# Patient Record
Sex: Female | Born: 1960
Health system: Southern US, Community
[De-identification: ages and names within clinical notes are randomized; demographics above are authoritative.]

## PROBLEM LIST (undated history)

## (undated) DIAGNOSIS — Z780 Asymptomatic menopausal state: Secondary | ICD-10-CM

## (undated) DIAGNOSIS — K219 Gastro-esophageal reflux disease without esophagitis: Secondary | ICD-10-CM

## (undated) DIAGNOSIS — T7840XA Allergy, unspecified, initial encounter: Secondary | ICD-10-CM

## (undated) HISTORY — DX: Gastro-esophageal reflux disease without esophagitis: K21.9

## (undated) HISTORY — DX: Asymptomatic menopausal state: Z78.0

## (undated) HISTORY — DX: Allergy, unspecified, initial encounter: T78.40XA

---

## 1999-10-08 ENCOUNTER — Encounter: Payer: Self-pay | Admitting: Emergency Medicine

## 1999-10-08 ENCOUNTER — Emergency Department (HOSPITAL_COMMUNITY): Admission: EM | Admit: 1999-10-08 | Discharge: 1999-10-08 | Payer: Self-pay

## 2011-05-11 ENCOUNTER — Encounter: Payer: Self-pay | Admitting: Physician Assistant

## 2011-08-13 ENCOUNTER — Encounter: Payer: Self-pay | Admitting: Physician Assistant

## 2011-08-21 ENCOUNTER — Ambulatory Visit: Payer: Managed Care, Other (non HMO) | Attending: *Deleted | Admitting: Physical Therapy

## 2011-08-21 DIAGNOSIS — IMO0001 Reserved for inherently not codable concepts without codable children: Secondary | ICD-10-CM | POA: Insufficient documentation

## 2011-08-21 DIAGNOSIS — R5381 Other malaise: Secondary | ICD-10-CM | POA: Insufficient documentation

## 2011-08-21 DIAGNOSIS — M25559 Pain in unspecified hip: Secondary | ICD-10-CM | POA: Insufficient documentation

## 2011-08-23 ENCOUNTER — Ambulatory Visit: Payer: Managed Care, Other (non HMO) | Admitting: Physical Therapy

## 2012-02-10 DIAGNOSIS — E0500 Thyrotoxicosis with diffuse goiter without thyrotoxic crisis or storm: Secondary | ICD-10-CM | POA: Insufficient documentation

## 2012-06-05 ENCOUNTER — Telehealth: Payer: Self-pay

## 2012-06-05 NOTE — Telephone Encounter (Signed)
LMOM to call.

## 2012-07-14 NOTE — Telephone Encounter (Signed)
LMOM to call.

## 2012-07-25 NOTE — Telephone Encounter (Signed)
Letter to pt and PCP.  

## 2013-05-21 ENCOUNTER — Encounter: Payer: Self-pay | Admitting: Physician Assistant

## 2015-07-19 DIAGNOSIS — H524 Presbyopia: Secondary | ICD-10-CM | POA: Diagnosis not present

## 2015-07-19 DIAGNOSIS — Z0100 Encounter for examination of eyes and vision without abnormal findings: Secondary | ICD-10-CM | POA: Diagnosis not present

## 2015-10-14 DIAGNOSIS — E0500 Thyrotoxicosis with diffuse goiter without thyrotoxic crisis or storm: Secondary | ICD-10-CM | POA: Diagnosis not present

## 2016-02-22 ENCOUNTER — Telehealth: Payer: Self-pay | Admitting: Physician Assistant

## 2016-02-22 MED ORDER — BETAMETHASONE DIPROPIONATE 0.05 % EX CREA: TOPICAL_CREAM | Freq: Two times a day (BID) | CUTANEOUS | 0 refills | Status: DC

## 2016-02-22 NOTE — Telephone Encounter (Signed)
Patient aware.

## 2016-02-22 NOTE — Telephone Encounter (Signed)
Cream, inhaler or nasal spray?

## 2016-02-22 NOTE — Telephone Encounter (Signed)
Please review and advise.

## 2016-02-22 NOTE — Telephone Encounter (Signed)
Betamethasone cream ? ?

## 2016-03-27 ENCOUNTER — Ambulatory Visit (INDEPENDENT_AMBULATORY_CARE_PROVIDER_SITE_OTHER): Payer: Managed Care, Other (non HMO) | Admitting: Physician Assistant

## 2016-03-27 ENCOUNTER — Encounter: Payer: Self-pay | Admitting: Physician Assistant

## 2016-03-27 ENCOUNTER — Encounter (INDEPENDENT_AMBULATORY_CARE_PROVIDER_SITE_OTHER): Payer: Self-pay

## 2016-03-27 VITALS — BP 121/81 | HR 79 | Temp 97.4°F | Ht 64.25 in | Wt 138.2 lb

## 2016-03-27 DIAGNOSIS — L500 Allergic urticaria: Secondary | ICD-10-CM | POA: Insufficient documentation

## 2016-03-27 DIAGNOSIS — K219 Gastro-esophageal reflux disease without esophagitis: Secondary | ICD-10-CM

## 2016-03-27 DIAGNOSIS — Z780 Asymptomatic menopausal state: Secondary | ICD-10-CM | POA: Insufficient documentation

## 2016-03-27 DIAGNOSIS — L989 Disorder of the skin and subcutaneous tissue, unspecified: Secondary | ICD-10-CM | POA: Insufficient documentation

## 2016-03-27 DIAGNOSIS — N951 Menopausal and female climacteric states: Secondary | ICD-10-CM | POA: Insufficient documentation

## 2016-03-27 DIAGNOSIS — B001 Herpesviral vesicular dermatitis: Secondary | ICD-10-CM | POA: Insufficient documentation

## 2016-03-27 DIAGNOSIS — Z01419 Encounter for gynecological examination (general) (routine) without abnormal findings: Secondary | ICD-10-CM | POA: Diagnosis not present

## 2016-03-27 DIAGNOSIS — R238 Other skin changes: Secondary | ICD-10-CM

## 2016-03-27 DIAGNOSIS — J3089 Other allergic rhinitis: Secondary | ICD-10-CM | POA: Insufficient documentation

## 2016-03-27 MED ORDER — AMOXICILLIN-POT CLAVULANATE 875-125 MG PO TABS: 1.0000 | ORAL_TABLET | Freq: Two times a day (BID) | ORAL | 0 refills | Status: DC

## 2016-03-27 MED ORDER — RANITIDINE HCL 300 MG PO TABS: 300.0000 mg | ORAL_TABLET | Freq: Every day | ORAL | 11 refills | Status: DC

## 2016-03-27 MED ORDER — GABAPENTIN 100 MG PO CAPS: 100.0000 mg | ORAL_CAPSULE | Freq: Every day | ORAL | 3 refills | Status: DC

## 2016-03-27 MED ORDER — CONJ ESTROG-MEDROXYPROGEST ACE 0.625-2.5 MG PO TABS: 2.0000 | ORAL_TABLET | Freq: Every day | ORAL | 11 refills | Status: DC

## 2016-03-27 NOTE — Patient Instructions (Signed)
Hives Hives are itchy, red, swollen areas of the skin. They can vary in size and location on your body. Hives can come and go for hours or several days (acute hives) or for several weeks (chronic hives). Hives do not spread from person to person (noncontagious). They may get worse with scratching, exercise, and emotional stress. CAUSES   Allergic reaction to food, additives, or drugs.  Infections, including the common cold.  Illness, such as vasculitis, lupus, or thyroid disease.  Exposure to sunlight, heat, or cold.  Exercise.  Stress.  Contact with chemicals. SYMPTOMS   Red or white swollen patches on the skin. The patches may change size, shape, and location quickly and repeatedly.  Itching.  Swelling of the hands, feet, and face. This may occur if hives develop deeper in the skin. DIAGNOSIS  Your caregiver can usually tell what is wrong by performing a physical exam. Skin or blood tests may also be done to determine the cause of your hives. In some cases, the cause cannot be determined. TREATMENT  Mild cases usually get better with medicines such as antihistamines. Severe cases may require an emergency epinephrine injection. If the cause of your hives is known, treatment includes avoiding that trigger.  HOME CARE INSTRUCTIONS   Avoid causes that trigger your hives.  Take antihistamines as directed by your caregiver to reduce the severity of your hives. Non-sedating or low-sedating antihistamines are usually recommended. Do not drive while taking an antihistamine.  Take any other medicines prescribed for itching as directed by your caregiver.  Wear loose-fitting clothing.  Keep all follow-up appointments as directed by your caregiver. SEEK MEDICAL CARE IF:   You have persistent or severe itching that is not relieved with medicine.  You have painful or swollen joints. SEEK IMMEDIATE MEDICAL CARE IF:   You have a fever.  Your tongue or lips are swollen.  You have  trouble breathing or swallowing.  You feel tightness in the throat or chest.  You have abdominal pain. These problems may be the first sign of a life-threatening allergic reaction. Call your local emergency services (911 in [place] MAKE SURE YOU:   Understand these instructions.  Will watch your condition.  Will get help right away if you are not doing well or get worse.   This information is not intended to replace advice given to you by your health care provider. Make sure you discuss any questions you have with your health care provider.   Document Released: 05/21/2005 Document Revised: 05/26/2013 Document Reviewed: 08/14/2011 Elsevier Interactive Patient Education 2016 Elsevier Inc.  

## 2016-03-27 NOTE — Progress Notes (Signed)
BP 121/81   Pulse 79   Temp 97.4 F (36.3 C) (Oral)   Ht 5' 4.25" (1.632 m)   Wt 138 lb 3.2 oz (62.7 kg)   LMP 04/02/2015   BMI 23.54 kg/m    Subjective:    Patient ID: Alexandria Ingram, female    DOB: 06/02/1961, 55 y.o.   MRN: [id number]  Alexandria Ingram is a 55 y.o. female presenting on 03/27/2016 for Annual Exam  HPI Patient here to be established as new patient at [facility].  This patient is known to me from [facility].  This patient comes in for her annual well exam. She is having severe menopausal symptoms include seeing vasomotor symptoms throughout the day and night. She is having vulvar symptoms of very dry at times and burning and hurting in the fall and labial areas. She has noted a change in the scan. It does affect the entire vulvar area, completely to the anus.  Both anti-fungal and betamethasone cream has not improved the condition in this area. She had these medications from before.  She is continuing with chronic allergic rhinitis and urticarial hives. Over-the-counter is not making much difference in the treatment of the urticaria. We will seek further treatment on this.  She needs refills on her regular maintenance medications. We will send these as needed. We have reviewed her lab work today. She is able to get that then performed through her work. Only her LDL was slightly elevated at 140 and we strongly encourage her diet and exercise to be stronger to recheck this in 6 months. All of her other labs are within normal limits.  Past Medical History:  Diagnosis Date  . Allergy   . GERD (gastroesophageal reflux disease)   . Menopause      Relevant past medical, surgical, family and social history reviewed and updated as indicated. Interim medical history since our last visit reviewed. Allergies and medications reviewed and updated.   Data reviewed from any sources in EPIC.  Review of Systems  Constitutional: Negative.   Negative for activity change, fatigue, fever and unexpected weight change.  HENT: Positive for postnasal drip and sneezing.   Eyes: Positive for itching.  Respiratory: Negative.  Negative for cough.   Cardiovascular: Negative.  Negative for chest pain.  Gastrointestinal: Negative.  Negative for abdominal pain.  Endocrine: Negative.   Genitourinary: Positive for vaginal pain. Negative for dysuria, genital sores, hematuria and menstrual problem.  Musculoskeletal: Negative.   Skin: Positive for color change and rash.  Neurological: Negative.      Social History   Social History  . Marital status: Single    Spouse name: N/A  . Number of children: N/A  . Years of education: N/A   Occupational History  . Not on file.   Social History Main Topics  . Smoking status: Never Smoker  . Smokeless tobacco: Never Used  . Alcohol use No  . Drug use: No  . Sexual activity: Not on file   Other Topics Concern  . Not on file   Social History Narrative  . No narrative on file    History reviewed. No pertinent surgical history.  History reviewed. No pertinent family history.    Medication List       Accurate as of 03/27/16  1:37 PM. Always use your most recent med list.          amoxicillin-clavulanate 875-125 MG tablet Commonly known as:  AUGMENTIN Take 1 tablet  by mouth 2 (two) times daily.   betamethasone dipropionate 0.05 % cream Commonly known as:  DIPROLENE Apply topically 2 (two) times daily.   esomeprazole 40 MG capsule Commonly known as:  NEXIUM Take 40 mg by mouth daily.   estrogen (conjugated)-medroxyprogesterone 0.625-2.5 MG tablet Commonly known as:  PREMPRO Take 2 tablets by mouth daily.   gabapentin 100 MG capsule Commonly known as:  NEURONTIN Take 1-3 capsules (100-300 mg total) by mouth at bedtime.   ranitidine 300 MG tablet Commonly known as:  ZANTAC Take 1 tablet (300 mg total) by mouth at bedtime.   valACYclovir 1000 MG tablet Commonly known  as:  VALTREX   ZOVIRAX 5 % Generic drug:  acyclovir cream          Objective:    BP 121/81   Pulse 79   Temp 97.4 F (36.3 C) (Oral)   Ht 5' 4.25" (1.632 m)   Wt 138 lb 3.2 oz (62.7 kg)   LMP 04/02/2015   BMI 23.54 kg/m   Allergies  Allergen Reactions  . Latex Swelling   Wt Readings from Last 3 Encounters:  03/27/16 138 lb 3.2 oz (62.7 kg)    Physical Exam  Constitutional: She is oriented to person, place, and time. She appears well-developed and well-nourished.  HENT:  Head: Normocephalic and atraumatic.  Eyes: Conjunctivae and EOM are normal. Pupils are equal, round, and reactive to light.  Neck: Normal range of motion. Neck supple.  Cardiovascular: Normal rate, regular rhythm, normal heart sounds and intact distal pulses.   Pulmonary/Chest: Effort normal and breath sounds normal. Right breast exhibits no mass, no skin change and no tenderness. Left breast exhibits no mass, no skin change and no tenderness. Breasts are symmetrical.  Abdominal: Soft. Bowel sounds are normal.  Genitourinary: Uterus normal. Rectal exam shows no fissure. No breast swelling, tenderness, discharge or bleeding. There is no tenderness or lesion on the right labia. There is no tenderness or lesion on the left labia. Uterus is not deviated, not enlarged and not tender. Cervix exhibits no motion tenderness, no discharge and no friability. Right adnexum displays no mass, no tenderness and no fullness. Left adnexum displays no mass, no tenderness and no fullness. There is tenderness in the vagina. No bleeding in the vagina. No vaginal discharge found.  Genitourinary Comments: At the posterior half of both labia there is a loss of pigment to her skin. This is a new finding. I have performed her exam several years in the road and this is new.  Neurological: She is alert and oriented to person, place, and time. She has normal reflexes.  Skin: Skin is warm and dry. No rash noted.  Psychiatric: She has a  normal mood and affect. Her behavior is normal. Judgment and thought content normal.       Assessment & Plan:   1. Well female exam with routine gynecological exam - Pap IG w/ reflex to HPV when ASC-U  2. Menopause - estrogen, conjugated,-medroxyprogesterone (PREMPRO) 0.625-2.5 MG tablet; Take 2 tablets by mouth daily.  Dispense: 60 tablet; Refill: 11  3. Menopausal vasomotor syndrome - gabapentin (NEURONTIN) 100 MG capsule; Take 1-3 capsules (100-300 mg total) by mouth at bedtime.  Dispense: 90 capsule; Refill: 3  4. Chronic nonseasonal allergic rhinitis due to pollen  5. Allergic urticaria - ranitidine (ZANTAC) 300 MG tablet; Take 1 tablet (300 mg total) by mouth at bedtime.  Dispense: 30 tablet; Refill: 11  6. Abnormal skin of vulva - Ambulatory referral to  Dermatology  7. Abnormal skin color - Ambulatory referral to Dermatology  8. Gastroesophageal reflux disease without esophagitis - esomeprazole (NEXIUM) 40 MG capsule; Take 40 mg by mouth daily.; Refill: 5  9. Fever blister - ZOVIRAX 5 %; ; Refill: 0 - valACYclovir (VALTREX) 1000 MG tablet; ; Refill: 0   Continue all other maintenance medications as listed above. Educational handout given for urticaria  Follow up plan: Return if symptoms worsen or fail to improve.  Remus Loffler PA-C [facility] [address]  [place], [place] [address] [phone]   03/27/2016, 1:37 PM

## 2016-04-12 ENCOUNTER — Telehealth: Payer: Self-pay | Admitting: Physician Assistant

## 2016-04-13 NOTE — Telephone Encounter (Signed)
noted 

## 2016-04-23 ENCOUNTER — Other Ambulatory Visit: Payer: Self-pay | Admitting: Physician Assistant

## 2016-04-23 DIAGNOSIS — Z780 Asymptomatic menopausal state: Secondary | ICD-10-CM

## 2016-04-23 MED ORDER — CONJ ESTROG-MEDROXYPROGEST ACE 0.625-2.5 MG PO TABS: 2.0000 | ORAL_TABLET | Freq: Every day | ORAL | 3 refills | Status: DC

## 2016-04-23 NOTE — Telephone Encounter (Signed)
Patient aware that rx sent to pharmacy. 

## 2016-04-23 NOTE — Telephone Encounter (Signed)
sent 

## 2016-05-17 ENCOUNTER — Telehealth: Payer: Self-pay | Admitting: Physician Assistant

## 2016-05-17 NOTE — Telephone Encounter (Signed)
Pt given appt for mobile mammogram 10/01/16 at 10:40.

## 2016-05-22 ENCOUNTER — Other Ambulatory Visit: Payer: Self-pay | Admitting: Physician Assistant

## 2016-06-11 ENCOUNTER — Encounter (INDEPENDENT_AMBULATORY_CARE_PROVIDER_SITE_OTHER): Payer: Self-pay

## 2016-06-12 ENCOUNTER — Telehealth: Payer: Self-pay | Admitting: Physician Assistant

## 2016-06-12 DIAGNOSIS — M545 Low back pain, unspecified: Secondary | ICD-10-CM

## 2016-06-12 DIAGNOSIS — G8929 Other chronic pain: Secondary | ICD-10-CM

## 2016-06-12 MED ORDER — LIDOCAINE 5 % EX PTCH: 3.0000 | MEDICATED_PATCH | CUTANEOUS | 6 refills | Status: DC

## 2016-06-12 NOTE — Telephone Encounter (Signed)
Patient aware.

## 2016-06-12 NOTE — Telephone Encounter (Signed)
Prescription sent to pharmacy.

## 2016-06-18 ENCOUNTER — Other Ambulatory Visit: Payer: Self-pay | Admitting: Physician Assistant

## 2016-06-19 ENCOUNTER — Telehealth: Payer: Self-pay | Admitting: Physician Assistant

## 2016-06-19 DIAGNOSIS — B001 Herpesviral vesicular dermatitis: Secondary | ICD-10-CM

## 2016-06-19 MED ORDER — ZOVIRAX 5 % EX CREA: 1.0000 "application " | TOPICAL_CREAM | CUTANEOUS | 11 refills | Status: DC

## 2016-06-19 NOTE — Telephone Encounter (Signed)
done

## 2016-06-19 NOTE — Telephone Encounter (Signed)
Prescription sent to pharmacy.

## 2016-08-01 ENCOUNTER — Ambulatory Visit (INDEPENDENT_AMBULATORY_CARE_PROVIDER_SITE_OTHER): Payer: 59 | Admitting: Physician Assistant

## 2016-08-01 VITALS — BP 130/81 | HR 76 | Temp 98.9°F | Ht 64.25 in | Wt 143.2 lb

## 2016-08-01 DIAGNOSIS — R232 Flushing: Secondary | ICD-10-CM | POA: Diagnosis not present

## 2016-08-01 DIAGNOSIS — Z780 Asymptomatic menopausal state: Secondary | ICD-10-CM | POA: Diagnosis not present

## 2016-08-01 DIAGNOSIS — R69 Illness, unspecified: Secondary | ICD-10-CM | POA: Diagnosis not present

## 2016-08-01 DIAGNOSIS — R51 Headache: Secondary | ICD-10-CM

## 2016-08-01 DIAGNOSIS — R519 Headache, unspecified: Secondary | ICD-10-CM | POA: Insufficient documentation

## 2016-08-01 DIAGNOSIS — Z0289 Encounter for other administrative examinations: Secondary | ICD-10-CM

## 2016-08-01 DIAGNOSIS — G43809 Other migraine, not intractable, without status migrainosus: Secondary | ICD-10-CM | POA: Diagnosis not present

## 2016-08-01 DIAGNOSIS — F339 Major depressive disorder, recurrent, unspecified: Secondary | ICD-10-CM

## 2016-08-01 DIAGNOSIS — G43909 Migraine, unspecified, not intractable, without status migrainosus: Secondary | ICD-10-CM | POA: Insufficient documentation

## 2016-08-01 MED ORDER — KETOROLAC TROMETHAMINE 60 MG/2ML IM SOLN
60.0000 mg | Freq: Once | INTRAMUSCULAR | Status: AC
  Administered 2016-08-01: 60 mg via INTRAMUSCULAR

## 2016-08-01 MED ORDER — CONJ ESTROG-MEDROXYPROGEST ACE 0.625-2.5 MG PO TABS: 1.0000 | ORAL_TABLET | Freq: Every day | ORAL | 3 refills | Status: DC

## 2016-08-01 MED ORDER — SERTRALINE HCL 50 MG PO TABS: 50.0000 mg | ORAL_TABLET | Freq: Every day | ORAL | 5 refills | Status: DC

## 2016-08-01 NOTE — Patient Instructions (Signed)

## 2016-08-02 ENCOUNTER — Telehealth: Payer: Self-pay | Admitting: Physician Assistant

## 2016-08-02 NOTE — Telephone Encounter (Signed)
NA 3/1-jhb

## 2016-08-02 NOTE — Telephone Encounter (Signed)
Try reducing dose to half tablet daily with a meal to see if it resolves. Call next week if not improved.

## 2016-08-03 ENCOUNTER — Encounter: Payer: Self-pay | Admitting: Physician Assistant

## 2016-08-03 NOTE — Progress Notes (Signed)
BP 130/81   Pulse 76   Temp 98.9 F (37.2 C) (Oral)   Ht 5' 4.25" (1.632 m)   Wt 143 lb 3.2 oz (65 kg)   LMP 04/02/2015   BMI 24.39 kg/m    Subjective:    Patient ID: Alexandria Ingram, female    DOB: [birth year], 58 y.o.   MRN: [id number]  HPI: Alexandria Ingram is a 56 y.o. female presenting on 08/01/2016 for Depression; Tender breast (Since starting the prempro ); Excessive Sweating; and Anxiety  Patient is here for multiple symptoms and conditions. She has known menopause and is having a very difficult time with lots of the physical and emotional changes. She is under significant amount of stress at work and is taking care of an older mother. She is having chronic headaches on a regular basis of 2-3 per week. She has even had to miss work for them. There is associated photophobia, phonophobia and some nausea and vomiting. She also is having increased depression symptoms and her PHQ was positive for depression.  We are going to have her on an intermittent FMLA leave due to her chronic headaches that are being very exacerbated right now. She may be out 1-7 days at a time. We will send her to neurologist as needed. Medications are being started. In addition we are starting some antidepressant for the difficulty of stress that she is experiencing.  Depression screen [facility] 2/9 08/01/2016 03/27/2016  Decreased Interest 2 0  Down, Depressed, Hopeless 2 0  PHQ - 2 Score 4 0  Altered sleeping 2 -  Tired, decreased energy 2 -  Change in appetite 2 -  Feeling bad or failure about yourself  1 -  Trouble concentrating 1 -  Moving slowly or fidgety/restless 0 -  Suicidal thoughts 0 -  PHQ-9 Score 12 -     Relevant past medical, surgical, family and social history reviewed and updated as indicated. Allergies and medications reviewed and updated.  Past Medical History:  Diagnosis Date  . Allergy   . GERD (gastroesophageal reflux disease)   . Menopause     No past surgical history on  file.  Review of Systems  Constitutional: Negative for activity change, fatigue and fever.  HENT: Negative.   Eyes: Negative.   Respiratory: Negative.  Negative for cough.   Cardiovascular: Negative.  Negative for chest pain.  Gastrointestinal: Positive for nausea and vomiting. Negative for abdominal pain.  Endocrine: Negative.   Genitourinary: Negative.  Negative for dysuria.  Musculoskeletal: Negative.   Skin: Negative.   Neurological: Positive for dizziness, weakness, numbness and headaches.  Psychiatric/Behavioral: Positive for decreased concentration and dysphoric mood.    Allergies as of 08/01/2016      Reactions   Latex Swelling      Medication List       Accurate as of 08/01/16 11:59 PM. Always use your most recent med list.          betamethasone dipropionate 0.05 % cream Commonly known as:  DIPROLENE apply to affected area twice a day   esomeprazole 40 MG capsule Commonly known as:  NEXIUM Take 40 mg by mouth daily.   estrogen (conjugated)-medroxyprogesterone 0.625-2.5 MG tablet Commonly known as:  PREMPRO Take 1-2 tablets by mouth daily.   gabapentin 100 MG capsule Commonly known as:  NEURONTIN Take 1-3 capsules (100-300 mg total) by mouth at bedtime.   lidocaine 5 % Commonly known as:  LIDODERM Place 3 patches onto the skin daily. Remove &  Discard patch within 12 hours or as directed by MD   sertraline 50 MG tablet Commonly known as:  ZOLOFT Take 1 tablet (50 mg total) by mouth daily.   valACYclovir 1000 MG tablet Commonly known as:  VALTREX   ZOVIRAX 5 % Generic drug:  acyclovir cream Apply 1 application topically every 4 (four) hours.          Objective:    BP 130/81   Pulse 76   Temp 98.9 F (37.2 C) (Oral)   Ht 5' 4.25" (1.632 m)   Wt 143 lb 3.2 oz (65 kg)   LMP 04/02/2015   BMI 24.39 kg/m   Allergies  Allergen Reactions  . Latex Swelling    Physical Exam  Constitutional: She is oriented to person, place, and time. She  appears well-developed and well-nourished.  HENT:  Head: Normocephalic and atraumatic.  Right Ear: Tympanic membrane, external ear and ear canal normal.  Left Ear: Tympanic membrane, external ear and ear canal normal.  Nose: Nose normal. No rhinorrhea.  Mouth/Throat: Oropharynx is clear and moist and mucous membranes are normal. No oropharyngeal exudate or posterior oropharyngeal erythema.  Eyes: Conjunctivae and EOM are normal. Pupils are equal, round, and reactive to light.  Neck: Normal range of motion. Neck supple.  Cardiovascular: Normal rate, regular rhythm, normal heart sounds and intact distal pulses.   Pulmonary/Chest: Effort normal and breath sounds normal.  Abdominal: Soft. Bowel sounds are normal.  Neurological: She is alert and oriented to person, place, and time. She has normal reflexes.  Skin: Skin is warm and dry. No rash noted.  Psychiatric: She has a normal mood and affect. Her behavior is normal. Judgment and thought content normal.  Nursing note and vitals reviewed.   No results found for this or any previous visit.    Assessment & Plan:   1. Acute nonintractable headache, unspecified headache type  2. Menopause - estrogen, conjugated,-medroxyprogesterone (PREMPRO) 0.625-2.5 MG tablet; Take 1-2 tablets by mouth daily.  Dispense: 180 tablet; Refill: 3  3. Hot flashes  4. Depression, recurrent (HCC) - sertraline (ZOLOFT) 50 MG tablet; Take 1 tablet (50 mg total) by mouth daily.  Dispense: 30 tablet; Refill: 5  5. Other migraine without status migrainosus, not intractable - ketorolac (TORADOL) injection 60 mg; Inject 2 mLs (60 mg total) into the muscle once.   Continue all other maintenance medications as listed above.  Follow up plan: Return in about 4 weeks (around 08/29/2016) for recheck.  Educational handout given for depression  Remus Loffler PA-C [facility] [address]  [place], [place] [address] [phone]   08/03/2016,  3:56 PM

## 2016-08-10 NOTE — Telephone Encounter (Signed)
Patient said she had already got the message to take half of the pill.She says  the whole pill caused her to be sick for a month!!  (This call was done concerning message from 8 days prior).

## 2016-08-14 ENCOUNTER — Other Ambulatory Visit: Payer: Self-pay | Admitting: Physician Assistant

## 2016-08-14 DIAGNOSIS — B001 Herpesviral vesicular dermatitis: Secondary | ICD-10-CM

## 2016-08-14 NOTE — Telephone Encounter (Signed)
What is the name of the medication? valACYclovir (VALTREX) 1000 MG tablet  Have you contacted your pharmacy to request a refill? Yes no response, per pt 3 days now  Which pharmacy would you like this sent to? [facility] in [place]   Patient notified that their request is being sent to the clinical staff for review and that they should receive a call once it is complete. If they do not receive a call within 24 hours they can check with their pharmacy or our office.

## 2016-08-17 MED ORDER — VALACYCLOVIR HCL 1 G PO TABS: 1000.0000 mg | ORAL_TABLET | Freq: Three times a day (TID) | ORAL | 1 refills | Status: DC

## 2016-08-29 ENCOUNTER — Ambulatory Visit: Payer: 59 | Admitting: Physician Assistant

## 2016-09-20 ENCOUNTER — Other Ambulatory Visit: Payer: Self-pay | Admitting: *Deleted

## 2016-09-20 DIAGNOSIS — F339 Major depressive disorder, recurrent, unspecified: Secondary | ICD-10-CM

## 2016-09-20 MED ORDER — SERTRALINE HCL 50 MG PO TABS: 50.0000 mg | ORAL_TABLET | Freq: Every day | ORAL | 0 refills | Status: DC

## 2016-10-01 ENCOUNTER — Encounter: Payer: Managed Care, Other (non HMO) | Admitting: *Deleted

## 2016-10-01 DIAGNOSIS — Z1231 Encounter for screening mammogram for malignant neoplasm of breast: Secondary | ICD-10-CM | POA: Diagnosis not present

## 2016-10-24 ENCOUNTER — Other Ambulatory Visit: Payer: Self-pay | Admitting: Physician Assistant

## 2016-10-24 MED ORDER — AMOXICILLIN 500 MG PO CAPS: 1000.0000 mg | ORAL_CAPSULE | Freq: Two times a day (BID) | ORAL | 0 refills | Status: DC

## 2016-12-20 ENCOUNTER — Other Ambulatory Visit: Payer: Self-pay | Admitting: Physician Assistant

## 2016-12-27 DIAGNOSIS — H524 Presbyopia: Secondary | ICD-10-CM | POA: Diagnosis not present

## 2017-01-24 DIAGNOSIS — Z0289 Encounter for other administrative examinations: Secondary | ICD-10-CM

## 2017-03-17 ENCOUNTER — Other Ambulatory Visit: Payer: Self-pay | Admitting: Physician Assistant

## 2017-03-20 ENCOUNTER — Encounter: Payer: Self-pay | Admitting: Physician Assistant

## 2017-06-13 ENCOUNTER — Other Ambulatory Visit: Payer: Self-pay | Admitting: Physician Assistant

## 2017-06-13 DIAGNOSIS — Z780 Asymptomatic menopausal state: Secondary | ICD-10-CM

## 2017-06-28 ENCOUNTER — Other Ambulatory Visit: Payer: Self-pay | Admitting: *Deleted

## 2017-06-28 DIAGNOSIS — B001 Herpesviral vesicular dermatitis: Secondary | ICD-10-CM

## 2017-06-28 MED ORDER — ZOVIRAX 5 % EX CREA: 1.0000 "application " | TOPICAL_CREAM | CUTANEOUS | 0 refills | Status: DC

## 2017-07-01 ENCOUNTER — Telehealth: Payer: Self-pay | Admitting: Physician Assistant

## 2017-07-01 ENCOUNTER — Other Ambulatory Visit: Payer: Self-pay | Admitting: Physician Assistant

## 2017-07-01 MED ORDER — AMOXICILLIN 500 MG PO CAPS: 1000.0000 mg | ORAL_CAPSULE | Freq: Two times a day (BID) | ORAL | 0 refills | Status: DC

## 2017-07-01 NOTE — Telephone Encounter (Signed)
Please review and advise.

## 2017-07-01 NOTE — Telephone Encounter (Signed)
[facility]

## 2017-07-01 NOTE — Telephone Encounter (Signed)
What symptoms do you have? Cold chills sore throat fever  How long have you been sick? Friday  Have you been seen for this problem? yes  If your provider decides to give you a prescription, which pharmacy would you like for [facility] to be [facility] [place] [facility] [address]   Patient informed that this information will be sent to the clinical staff for review and that they should receive a follow up call.

## 2017-07-01 NOTE — Telephone Encounter (Signed)
Patient aware.

## 2017-07-03 ENCOUNTER — Telehealth: Payer: Self-pay | Admitting: Physician Assistant

## 2017-07-15 ENCOUNTER — Other Ambulatory Visit: Payer: Self-pay | Admitting: *Deleted

## 2017-07-15 DIAGNOSIS — B001 Herpesviral vesicular dermatitis: Secondary | ICD-10-CM

## 2017-07-15 MED ORDER — VALACYCLOVIR HCL 1 G PO TABS: 1000.0000 mg | ORAL_TABLET | Freq: Three times a day (TID) | ORAL | 0 refills | Status: DC

## 2017-07-15 NOTE — Telephone Encounter (Signed)
Next OV 08/16/17 

## 2017-07-16 ENCOUNTER — Encounter: Payer: 59 | Admitting: Physician Assistant

## 2017-07-17 ENCOUNTER — Other Ambulatory Visit: Payer: Self-pay | Admitting: Physician Assistant

## 2017-07-17 DIAGNOSIS — Z780 Asymptomatic menopausal state: Secondary | ICD-10-CM

## 2017-08-09 ENCOUNTER — Telehealth: Payer: Self-pay | Admitting: Physician Assistant

## 2017-08-09 MED ORDER — BETAMETHASONE DIPROPIONATE 0.05 % EX CREA: TOPICAL_CREAM | CUTANEOUS | 0 refills | Status: DC

## 2017-08-09 NOTE — Telephone Encounter (Signed)
Next Ov 08/16/16

## 2017-08-16 ENCOUNTER — Encounter: Payer: Self-pay | Admitting: Physician Assistant

## 2017-08-16 ENCOUNTER — Ambulatory Visit (INDEPENDENT_AMBULATORY_CARE_PROVIDER_SITE_OTHER): Payer: Managed Care, Other (non HMO) | Admitting: Physician Assistant

## 2017-08-16 VITALS — BP 137/77 | HR 77 | Ht 64.25 in | Wt 154.0 lb

## 2017-08-16 DIAGNOSIS — Z01419 Encounter for gynecological examination (general) (routine) without abnormal findings: Secondary | ICD-10-CM

## 2017-08-16 DIAGNOSIS — Z0289 Encounter for other administrative examinations: Secondary | ICD-10-CM

## 2017-08-16 DIAGNOSIS — Z780 Asymptomatic menopausal state: Secondary | ICD-10-CM

## 2017-08-16 MED ORDER — CONJ ESTROG-MEDROXYPROGEST ACE 0.625-2.5 MG PO TABS: 1.0000 | ORAL_TABLET | Freq: Every day | ORAL | 3 refills | Status: DC

## 2017-08-16 NOTE — Patient Instructions (Signed)
In a few days you may receive a survey in the mail or online from [employer] regarding your visit with us today. Please take a moment to fill this out. Your feedback is very important to our whole office. It can help us better understand your needs as well as improve your experience and satisfaction. Thank you for taking your time to complete it. We care about you.  Korry Dalgleish, PA-C  

## 2017-08-18 NOTE — Progress Notes (Signed)
BP 137/77   Pulse 77   Ht 5' 4.25" (1.632 m)   Wt 154 lb (69.9 kg)   LMP 04/02/2015   BMI 26.23 kg/m    Subjective:    Patient ID: Alexandria Ingram, female    DOB: [birth year], 57 y.o.   MRN: [id number]  HPI: Alexandria Ingram is a 57 y.o. female presenting on 08/16/2017 for Annual Exam  This patient comes in for annual well physical examination. All medications are reviewed today. There are no reports of any problems with the medications. All of the medical conditions are reviewed and updated.  Lab work is reviewed and will be ordered as medically necessary. There are no new problems reported with today's visit.  Patient reports doing well overall.   Past Medical History:  Diagnosis Date  . Allergy   . GERD (gastroesophageal reflux disease)   . Menopause    Relevant past medical, surgical, family and social history reviewed and updated as indicated. Interim medical history since our last visit reviewed. Allergies and medications reviewed and updated. DATA REVIEWED: CHART IN EPIC  Family History reviewed for pertinent findings.  Review of Systems  Constitutional: Negative.  Negative for activity change, fatigue and fever.  HENT: Negative.   Eyes: Negative.   Respiratory: Negative.  Negative for cough.   Cardiovascular: Negative.  Negative for chest pain.  Gastrointestinal: Negative.  Negative for abdominal pain.  Endocrine: Negative.   Genitourinary: Negative.  Negative for dysuria.  Musculoskeletal: Negative.   Skin: Negative.   Neurological: Negative.     Allergies as of 08/16/2017      Reactions   Latex Swelling      Medication List        Accurate as of 08/16/17 11:59 PM. Always use your most recent med list.          betamethasone dipropionate 0.05 % cream Commonly known as:  DIPROLENE apply to affected area twice a day   estrogen (conjugated)-medroxyprogesterone 0.625-2.5 MG tablet Commonly known as:  PREMPRO Take 1-2 tablets by mouth daily.     gabapentin 100 MG capsule Commonly known as:  NEURONTIN Take 1-3 capsules (100-300 mg total) by mouth at bedtime.   lidocaine 5 % Commonly known as:  LIDODERM Place 3 patches onto the skin daily. Remove & Discard patch within 12 hours or as directed by MD   valACYclovir 1000 MG tablet Commonly known as:  VALTREX Take 1 tablet (1,000 mg total) by mouth 3 (three) times daily.   ZOVIRAX 5 % Generic drug:  acyclovir cream Apply 1 application topically every 4 (four) hours.          Objective:    BP 137/77   Pulse 77   Ht 5' 4.25" (1.632 m)   Wt 154 lb (69.9 kg)   LMP 04/02/2015   BMI 26.23 kg/m   Allergies  Allergen Reactions  . Latex Swelling    Wt Readings from Last 3 Encounters:  08/16/17 154 lb (69.9 kg)  08/01/16 143 lb 3.2 oz (65 kg)  03/27/16 138 lb 3.2 oz (62.7 kg)    Physical Exam  Constitutional: She is oriented to person, place, and time. She appears well-developed and well-nourished.  HENT:  Head: Normocephalic and atraumatic.  Eyes: Conjunctivae and EOM are normal. Pupils are equal, round, and reactive to light.  Neck: Normal range of motion. Neck supple.  Cardiovascular: Normal rate, regular rhythm, normal heart sounds and intact distal pulses.  Pulmonary/Chest: Effort normal and breath sounds normal.  Right breast exhibits no mass, no skin change and no tenderness. Left breast exhibits no mass, no skin change and no tenderness. Breasts are symmetrical.  Abdominal: Soft. Bowel sounds are normal.  Genitourinary: Vagina normal and uterus normal. Rectal exam shows no fissure. No breast swelling, tenderness, discharge or bleeding. There is no tenderness or lesion on the right labia. There is no tenderness or lesion on the left labia. Uterus is not deviated, not enlarged and not tender. Cervix exhibits no motion tenderness, no discharge and no friability. Right adnexum displays no mass, no tenderness and no fullness. Left adnexum displays no mass, no tenderness  and no fullness. No tenderness or bleeding in the vagina. No vaginal discharge found.  Neurological: She is alert and oriented to person, place, and time. She has normal reflexes.  Skin: Skin is warm and dry. No rash noted.  Psychiatric: She has a normal mood and affect. Her behavior is normal. Judgment and thought content normal.    No results found for this or any previous visit.    Assessment & Plan:   1. Well female exam with routine gynecological exam  2. Menopause - estrogen, conjugated,-medroxyprogesterone (PREMPRO) 0.625-2.5 MG tablet; Take 1-2 tablets by mouth daily.  Dispense: 180 tablet; Refill: 3   Continue all other maintenance medications as listed above.  Follow up plan: Return in about 6 months (around 02/16/2018).  Educational handout given for survey  Remus LofflerAngel S. Kyjuan Gause PA-C [facility] [address]  [place], [place] [id number] [phone]   08/18/2017, 8:28 PM

## 2017-08-24 ENCOUNTER — Other Ambulatory Visit: Payer: Self-pay | Admitting: Physician Assistant

## 2017-08-24 DIAGNOSIS — Z780 Asymptomatic menopausal state: Secondary | ICD-10-CM

## 2017-08-27 ENCOUNTER — Other Ambulatory Visit: Payer: Self-pay | Admitting: Physician Assistant

## 2017-08-27 ENCOUNTER — Telehealth: Payer: Self-pay | Admitting: *Deleted

## 2017-08-27 DIAGNOSIS — Z780 Asymptomatic menopausal state: Secondary | ICD-10-CM

## 2017-08-27 MED ORDER — PREDNISONE 10 MG (21) PO TBPK: ORAL_TABLET | ORAL | 0 refills | Status: DC

## 2017-08-27 MED ORDER — CONJ ESTROG-MEDROXYPROGEST ACE 0.625-2.5 MG PO TABS: 1.0000 | ORAL_TABLET | Freq: Every day | ORAL | 3 refills | Status: DC

## 2017-08-27 NOTE — Telephone Encounter (Signed)
I sent sterapred dose pack to pharmacy

## 2017-08-27 NOTE — Telephone Encounter (Signed)
Rcvd VM did not get Prempro refill Also discussed prednisone for Rash [facility] refill was set to no print, pt aware Please advise on prednisone

## 2017-10-08 ENCOUNTER — Other Ambulatory Visit: Payer: Self-pay | Admitting: Physician Assistant

## 2017-10-08 DIAGNOSIS — Z1231 Encounter for screening mammogram for malignant neoplasm of breast: Secondary | ICD-10-CM

## 2017-10-17 ENCOUNTER — Ambulatory Visit (INDEPENDENT_AMBULATORY_CARE_PROVIDER_SITE_OTHER): Payer: Managed Care, Other (non HMO)

## 2017-10-17 DIAGNOSIS — Z1231 Encounter for screening mammogram for malignant neoplasm of breast: Secondary | ICD-10-CM | POA: Diagnosis not present

## 2017-10-25 ENCOUNTER — Other Ambulatory Visit: Payer: Self-pay | Admitting: Physician Assistant

## 2017-11-25 ENCOUNTER — Other Ambulatory Visit: Payer: Self-pay | Admitting: *Deleted

## 2017-11-25 DIAGNOSIS — G8929 Other chronic pain: Secondary | ICD-10-CM

## 2017-11-25 DIAGNOSIS — M545 Low back pain, unspecified: Secondary | ICD-10-CM

## 2017-11-25 MED ORDER — LIDOCAINE 5 % EX PTCH: 3.0000 | MEDICATED_PATCH | CUTANEOUS | 3 refills | Status: DC

## 2017-12-03 ENCOUNTER — Telehealth: Payer: Self-pay | Admitting: *Deleted

## 2017-12-03 DIAGNOSIS — B001 Herpesviral vesicular dermatitis: Secondary | ICD-10-CM

## 2017-12-03 MED ORDER — ZOVIRAX 5 % EX CREA: 1.0000 "application " | TOPICAL_CREAM | CUTANEOUS | 2 refills | Status: DC

## 2017-12-03 NOTE — Telephone Encounter (Signed)
Refill sent to pharmacy.   

## 2017-12-24 ENCOUNTER — Telehealth: Payer: Self-pay | Admitting: Physician Assistant

## 2017-12-28 ENCOUNTER — Other Ambulatory Visit: Payer: Self-pay | Admitting: Physician Assistant

## 2017-12-30 NOTE — Telephone Encounter (Signed)
Last seen 3/19  Angel 

## 2018-01-02 NOTE — Telephone Encounter (Signed)
NA, NVM Closed encounter due to time since message came in

## 2018-01-22 DIAGNOSIS — Z029 Encounter for administrative examinations, unspecified: Secondary | ICD-10-CM

## 2018-03-01 ENCOUNTER — Other Ambulatory Visit: Payer: Self-pay | Admitting: Physician Assistant

## 2018-03-03 MED ORDER — BETAMETHASONE DIPROPIONATE 0.05 % EX CREA: TOPICAL_CREAM | CUTANEOUS | 0 refills | Status: DC

## 2018-03-03 NOTE — Telephone Encounter (Signed)
Refill failed, resent 

## 2018-03-03 NOTE — Addendum Note (Signed)
Addended by: Julious Payer D on: 03/03/2018 11:13 AM   Modules accepted: Orders

## 2018-04-01 ENCOUNTER — Other Ambulatory Visit: Payer: Self-pay

## 2018-04-01 DIAGNOSIS — Z780 Asymptomatic menopausal state: Secondary | ICD-10-CM

## 2018-04-01 MED ORDER — CONJ ESTROG-MEDROXYPROGEST ACE 0.625-2.5 MG PO TABS: 1.0000 | ORAL_TABLET | Freq: Every day | ORAL | 0 refills | Status: DC

## 2018-04-01 NOTE — Telephone Encounter (Signed)
Last seen 08/27/17

## 2018-05-05 ENCOUNTER — Telehealth: Payer: Self-pay | Admitting: *Deleted

## 2018-05-05 NOTE — Telephone Encounter (Signed)
Pt is needing a generic alternative to her Prempro for 30 days The Prempro is $181 even with GoodRx coupon Please advise, pt uses [facility]

## 2018-05-06 ENCOUNTER — Other Ambulatory Visit: Payer: Self-pay | Admitting: Physician Assistant

## 2018-05-06 ENCOUNTER — Telehealth: Payer: Self-pay | Admitting: Physician Assistant

## 2018-05-06 DIAGNOSIS — Z780 Asymptomatic menopausal state: Secondary | ICD-10-CM

## 2018-05-06 MED ORDER — MEDROXYPROGESTERONE ACETATE 5 MG PO TABS: 5.0000 mg | ORAL_TABLET | Freq: Every day | ORAL | 5 refills | Status: DC

## 2018-05-06 MED ORDER — CONJ ESTROG-MEDROXYPROGEST ACE 0.625-2.5 MG PO TABS: 1.0000 | ORAL_TABLET | Freq: Every day | ORAL | 0 refills | Status: DC

## 2018-05-06 MED ORDER — ESTRADIOL 1 MG PO TABS: 1.0000 mg | ORAL_TABLET | Freq: Every day | ORAL | 5 refills | Status: DC

## 2018-05-06 NOTE — Telephone Encounter (Signed)
Sent estradiol and provera

## 2018-05-06 NOTE — Telephone Encounter (Signed)
Pt states her insurance won't let her get a 30 day supply but she asked if we could try and send over 30 day rx to [facility] [place] to see if they would pay for it. Rx for 30 day supply sent over to pharmacy as requested by pt.

## 2018-05-12 NOTE — Telephone Encounter (Signed)
Attempts to contact pt without return call in over 3 days, will close encounter. 

## 2018-05-29 ENCOUNTER — Other Ambulatory Visit: Payer: Self-pay | Admitting: Physician Assistant

## 2018-06-29 ENCOUNTER — Other Ambulatory Visit: Payer: Self-pay | Admitting: Physician Assistant

## 2018-06-30 ENCOUNTER — Telehealth: Payer: Self-pay | Admitting: Physician Assistant

## 2018-06-30 NOTE — Telephone Encounter (Signed)
Patient aware.

## 2018-06-30 NOTE — Telephone Encounter (Signed)
Last seen 08/16/17  Angel  Needs to be seen 

## 2018-06-30 NOTE — Telephone Encounter (Signed)
If she is feeling poorly, she may warrant evaluation.   - Get plenty of rest and drink plenty of fluids. - Try to breathe moist air. Use a cold mist humidifier. - Consume warm fluids (soup or tea) to provide relief for a stuffy nose and to loosen phlegm. - For nasal stuffiness, try saline nasal spray or a Neti Pot. Afrin nasal spray can also be used but this product should not be used longer than 3 days or it will cause rebound nasal stuffiness (worsening nasal congestion). - For sore throat pain relief: use chloraseptic spray, suck on throat lozenges, hard candy or popsicles; gargle with warm salt water (1/4 tsp. salt per 8 oz. of water); and eat soft, bland foods. - Eat a well-balanced diet. If you cannot, ensure you are getting enough nutrients by taking a daily multivitamin. - Avoid dairy products, as they can thicken phlegm. - Avoid alcohol, as it impairs your body's immune system.  CONTACT YOUR DOCTOR IF YOU EXPERIENCE ANY OF THE FOLLOWING: - High fever - Ear pain - Sinus-type headache - Unusually severe cold symptoms - Cough that gets worse while other cold symptoms improve - Flare up of any chronic lung problem, such as asthma - Your symptoms persist longer than 2 weeks

## 2018-07-01 ENCOUNTER — Ambulatory Visit: Payer: 59 | Admitting: Physician Assistant

## 2018-07-01 ENCOUNTER — Encounter: Payer: Self-pay | Admitting: Physician Assistant

## 2018-07-01 VITALS — BP 133/88 | HR 90 | Temp 98.3°F | Ht 64.25 in | Wt 150.2 lb

## 2018-07-01 DIAGNOSIS — J3089 Other allergic rhinitis: Secondary | ICD-10-CM | POA: Diagnosis not present

## 2018-07-01 DIAGNOSIS — J0110 Acute frontal sinusitis, unspecified: Secondary | ICD-10-CM | POA: Insufficient documentation

## 2018-07-01 MED ORDER — ESTRADIOL 2 MG PO TABS: 2.0000 mg | ORAL_TABLET | Freq: Every day | ORAL | 5 refills | Status: DC

## 2018-07-01 MED ORDER — BETAMETHASONE DIPROPIONATE 0.05 % EX CREA: TOPICAL_CREAM | CUTANEOUS | 0 refills | Status: DC

## 2018-07-01 MED ORDER — AMOXICILLIN-POT CLAVULANATE 875-125 MG PO TABS: 1.0000 | ORAL_TABLET | Freq: Two times a day (BID) | ORAL | 0 refills | Status: DC

## 2018-07-01 MED ORDER — PREDNISONE 10 MG (21) PO TBPK: ORAL_TABLET | ORAL | 0 refills | Status: DC

## 2018-07-01 NOTE — Progress Notes (Signed)
BP 133/88   Pulse 90   Temp 98.3 F (36.8 C) (Oral)   Ht 5' 4.25" (1.632 m)   Wt 150 lb 3.2 oz (68.1 kg)   LMP 04/02/2015   BMI 25.58 kg/m    Subjective:    Patient ID: Alexandria Ingram, female    DOB: [birth year], 58 y.o.   MRN: [id number]  HPI: Alexandria Ingram is a 58 y.o. female presenting on 07/01/2018 for Dizziness; Nasal Congestion; Cough (little ); and facial pressure  This patient has had many days of sinus headache and postnasal drainage. There is copious drainage at times. Denies any fever at this time. There has been a history of sinus infections in the past.  No history of sinus surgery. There is cough at night. It has become more prevalent in recent days.   Past Medical History:  Diagnosis Date  . Allergy   . GERD (gastroesophageal reflux disease)   . Menopause    Relevant past medical, surgical, family and social history reviewed and updated as indicated. Interim medical history since our last visit reviewed. Allergies and medications reviewed and updated. DATA REVIEWED: CHART IN EPIC  Family History reviewed for pertinent findings.  Review of Systems  Constitutional: Positive for chills and fatigue. Negative for activity change and appetite change.  HENT: Positive for congestion, postnasal drip and sore throat.   Eyes: Negative.   Respiratory: Positive for cough and wheezing.   Cardiovascular: Negative.  Negative for chest pain, palpitations and leg swelling.  Gastrointestinal: Negative.   Genitourinary: Negative.   Musculoskeletal: Negative.   Skin: Negative.   Neurological: Positive for headaches.    Allergies as of 07/01/2018      Reactions   Latex Swelling      Medication List       Accurate as of July 01, 2018  1:13 PM. Always use your most recent med list.        amoxicillin-clavulanate 875-125 MG tablet Commonly known as:  AUGMENTIN Take 1 tablet by mouth 2 (two) times daily.   betamethasone dipropionate 0.05 % cream Commonly known  as:  DIPROLENE APPLY TO AFFECTED AREA TWICE A DAY   estradiol 2 MG tablet Commonly known as:  ESTRACE Take 1 tablet (2 mg total) by mouth daily.   lidocaine 5 % Commonly known as:  LIDODERM Place 3 patches onto the skin daily. Remove & Discard patch within 12 hours or as directed by MD   medroxyPROGESTERone 5 MG tablet Commonly known as:  PROVERA Take 1 tablet (5 mg total) by mouth daily.   predniSONE 10 MG (21) Tbpk tablet Commonly known as:  STERAPRED UNI-PAK 21 TAB As directed x 6 days   valACYclovir 1000 MG tablet Commonly known as:  VALTREX Take 1 tablet (1,000 mg total) by mouth 3 (three) times daily.   ZOVIRAX 5 % Generic drug:  acyclovir cream Apply 1 application topically every 4 (four) hours.          Objective:    BP 133/88   Pulse 90   Temp 98.3 F (36.8 C) (Oral)   Ht 5' 4.25" (1.632 m)   Wt 150 lb 3.2 oz (68.1 kg)   LMP 04/02/2015   BMI 25.58 kg/m   Allergies  Allergen Reactions  . Latex Swelling    Wt Readings from Last 3 Encounters:  07/01/18 150 lb 3.2 oz (68.1 kg)  08/16/17 154 lb (69.9 kg)  08/01/16 143 lb 3.2 oz (65 kg)    Physical Exam  Constitutional:      Appearance: She is well-developed.  HENT:     Head: Normocephalic and atraumatic.     Right Ear: Tympanic membrane and external ear normal. No middle ear effusion.     Left Ear: Tympanic membrane and external ear normal.  No middle ear effusion.     Nose: Mucosal edema and rhinorrhea present.     Right Sinus: No maxillary sinus tenderness.     Left Sinus: No maxillary sinus tenderness.     Mouth/Throat:     Pharynx: Uvula midline. Posterior oropharyngeal erythema present.  Eyes:     General:        Right eye: No discharge.        Left eye: No discharge.     Conjunctiva/sclera: Conjunctivae normal.     Pupils: Pupils are equal, round, and reactive to light.  Neck:     Musculoskeletal: Normal range of motion.  Cardiovascular:     Rate and Rhythm: Normal rate and regular  rhythm.     Heart sounds: Normal heart sounds.  Pulmonary:     Effort: Pulmonary effort is normal. No respiratory distress.     Breath sounds: Normal breath sounds. No wheezing.  Abdominal:     Palpations: Abdomen is soft.  Lymphadenopathy:     Cervical: No cervical adenopathy.  Skin:    General: Skin is warm and dry.  Neurological:     Mental Status: She is alert and oriented to person, place, and time.     No results found for this or any previous visit.    Assessment & Plan:   1. Chronic nonseasonal allergic rhinitis due to pollen - predniSONE (STERAPRED UNI-PAK 21 TAB) 10 MG (21) TBPK tablet; As directed x 6 days  Dispense: 21 tablet; Refill: 0  2. Acute non-recurrent frontal sinusitis - amoxicillin-clavulanate (AUGMENTIN) 875-125 MG tablet; Take 1 tablet by mouth 2 (two) times daily.  Dispense: 20 tablet; Refill: 0   Continue all other maintenance medications as listed above.  Follow up plan: No follow-ups on file.  Educational handout given for survey  Remus Loffler PA-C [facility] [address]  [place], [place] [address] [phone]   07/01/2018, 1:13 PM

## 2018-07-02 ENCOUNTER — Telehealth: Payer: Self-pay | Admitting: Physician Assistant

## 2018-07-02 NOTE — Telephone Encounter (Signed)
Seen yesterday and wants an extended note.

## 2018-07-02 NOTE — Telephone Encounter (Signed)
Okay to do note?

## 2018-07-03 NOTE — Telephone Encounter (Signed)
Letter mailed

## 2018-07-03 NOTE — Telephone Encounter (Signed)
Please mail the note because pt is too dizzy to come pick up

## 2018-07-03 NOTE — Telephone Encounter (Signed)
Pt wants to return on Monday 07/07/2018 to work

## 2018-07-05 ENCOUNTER — Other Ambulatory Visit: Payer: Self-pay | Admitting: Physician Assistant

## 2018-07-05 DIAGNOSIS — B001 Herpesviral vesicular dermatitis: Secondary | ICD-10-CM

## 2018-07-07 NOTE — Telephone Encounter (Signed)
Work note that was mailed on 07/03/2018 please fax to [phone]

## 2018-07-07 NOTE — Telephone Encounter (Signed)
Letter faxed- patient aware.  

## 2018-07-23 ENCOUNTER — Other Ambulatory Visit: Payer: Self-pay | Admitting: Physician Assistant

## 2018-07-23 DIAGNOSIS — S8992XA Unspecified injury of left lower leg, initial encounter: Secondary | ICD-10-CM | POA: Insufficient documentation

## 2018-07-24 DIAGNOSIS — Z029 Encounter for administrative examinations, unspecified: Secondary | ICD-10-CM

## 2018-07-27 ENCOUNTER — Other Ambulatory Visit: Payer: Self-pay | Admitting: Physician Assistant

## 2018-07-27 DIAGNOSIS — B001 Herpesviral vesicular dermatitis: Secondary | ICD-10-CM

## 2018-08-11 ENCOUNTER — Other Ambulatory Visit: Payer: Self-pay | Admitting: Physician Assistant

## 2018-08-11 DIAGNOSIS — B001 Herpesviral vesicular dermatitis: Secondary | ICD-10-CM

## 2018-08-14 MED ORDER — VALACYCLOVIR HCL 1 G PO TABS: 1000.0000 mg | ORAL_TABLET | Freq: Three times a day (TID) | ORAL | 0 refills | Status: DC

## 2018-08-14 NOTE — Telephone Encounter (Signed)
RF failed, resent 

## 2018-08-14 NOTE — Addendum Note (Signed)
Addended by: Julious Payer D on: 08/14/2018 02:28 PM   Modules accepted: Orders

## 2018-08-27 ENCOUNTER — Other Ambulatory Visit: Payer: Self-pay

## 2018-08-27 ENCOUNTER — Telehealth (INDEPENDENT_AMBULATORY_CARE_PROVIDER_SITE_OTHER): Payer: 59 | Admitting: Physician Assistant

## 2018-08-27 ENCOUNTER — Encounter: Payer: Self-pay | Admitting: Physician Assistant

## 2018-08-27 DIAGNOSIS — M25469 Effusion, unspecified knee: Secondary | ICD-10-CM

## 2018-08-27 DIAGNOSIS — S8990XA Unspecified injury of unspecified lower leg, initial encounter: Secondary | ICD-10-CM | POA: Diagnosis not present

## 2018-08-27 DIAGNOSIS — M25569 Pain in unspecified knee: Secondary | ICD-10-CM | POA: Diagnosis not present

## 2018-08-27 MED ORDER — DICLOFENAC SODIUM 75 MG PO TBEC: 75.0000 mg | DELAYED_RELEASE_TABLET | Freq: Two times a day (BID) | ORAL | 0 refills | Status: DC

## 2018-08-27 NOTE — Progress Notes (Signed)
Addendum to 08/27/2018 telephone visit  Location of the patient should have been home Others present on the call should have been no one  The start of the visit was 8 AM and the end time was 8:09 AM  Remus Loffler PA-C [facility] [address]  [place], [place] [address] [phone]

## 2018-08-27 NOTE — Progress Notes (Signed)
Telephone visit  Subjective: CC: Chronic knee pain PCP: Remus Loffler, PA-C Alexandria Ingram is a 58 y.o. female calls for telephone consult today. Patient provides verbal consent for consult held via phone.  Location of patient: 8:00 am Location of provider: [facility] Others present for call: 8:09 am  We had a phone virtual visit with the patient today concerning her knee.  Several weeks ago she was walking over some rocks and hyperextended her knee.  She felt pain at that time.  It did slightly improve.  However in the past 4 weeks she has had almost constant pain in the knee with swelling to the front and also to the back.  When she sits still for a long time she will get very stiff.  Whenever she sleeps at night she will have stiffness in the morning.  Walking does seem to relieve that and she does try to keep a walk-in every day.  She has been taking Aleve PM with some help.  She stopped it in the last couple of days.  She states she had worn a brace some but by the end of the day it is swelling and she has to remove it.   ROS: Per HPI  Allergies  Allergen Reactions  . Latex Swelling   Past Medical History:  Diagnosis Date  . Allergy   . GERD (gastroesophageal reflux disease)   . Menopause     Current Outpatient Medications:  .  acyclovir cream (ZOVIRAX) 5 %, APPLY TO AFFECTED AREA EVERY 4 HOURS, Disp: 5 g, Rfl: 9 .  amoxicillin-clavulanate (AUGMENTIN) 875-125 MG tablet, Take 1 tablet by mouth 2 (two) times daily., Disp: 20 tablet, Rfl: 0 .  betamethasone dipropionate (DIPROLENE) 0.05 % cream, APPLY TO AFFECTED AREA TWICE A DAY, Disp: 60 g, Rfl: 0 .  estradiol (ESTRACE) 2 MG tablet, TAKE 1 TABLET BY MOUTH EVERY DAY, Disp: 90 tablet, Rfl: 1 .  lidocaine (LIDODERM) 5 %, Place 3 patches onto the skin daily. Remove & Discard patch within 12 hours or as directed by MD, Disp: 90 patch, Rfl: 3 .  medroxyPROGESTERone (PROVERA) 5 MG tablet, Take 1 tablet (5 mg total) by mouth daily., Disp:  30 tablet, Rfl: 5 .  predniSONE (STERAPRED UNI-PAK 21 TAB) 10 MG (21) TBPK tablet, As directed x 6 days, Disp: 21 tablet, Rfl: 0 .  valACYclovir (VALTREX) 1000 MG tablet, Take 1 tablet (1,000 mg total) by mouth 3 (three) times daily., Disp: 270 tablet, Rfl: 0  Assessment/ Plan: 58 y.o. female   1. Knee injury, initial encounter - diclofenac (VOLTAREN) 75 MG EC tablet; Take 1 tablet (75 mg total) by mouth 2 (two) times daily.  Dispense: 40 tablet; Refill: 0  Rest, ice packs per week when swallow Continue compression garment as tolerated Call back in 2 weeks if things are not improving and we will plan Ortho at that time.  Because it will have been on 6 weeks of conservative therapy   2. Pain and swelling of knee, unspecified laterality - diclofenac (VOLTAREN) 75 MG EC tablet; Take 1 tablet (75 mg total) by mouth 2 (two) times daily.  Dispense: 40 tablet; Refill: 0   No orders of the defined types were placed in this encounter.   Prudy Feeler PA-C [facility] [phone]

## 2018-08-28 ENCOUNTER — Encounter: Payer: Self-pay | Admitting: Physician Assistant

## 2018-08-29 ENCOUNTER — Encounter: Payer: Self-pay | Admitting: *Deleted

## 2018-08-29 ENCOUNTER — Telehealth: Payer: Self-pay | Admitting: Physician Assistant

## 2018-08-29 NOTE — Telephone Encounter (Signed)
Can you message her in Mychart about left knee injury/pain/swelling? She has meds, getting compression. Can do epsom soaks or just regular. Biofreeze, blue emu

## 2018-08-29 NOTE — Telephone Encounter (Signed)
PT had a telephone visit with AJ other day is requesting that the information AJ suggest she does with epsom salt and other thngs be released to [employer]

## 2018-08-29 NOTE — Telephone Encounter (Signed)
Please advise 

## 2018-08-31 ENCOUNTER — Other Ambulatory Visit: Payer: Self-pay | Admitting: Physician Assistant

## 2018-09-18 ENCOUNTER — Encounter: Payer: Self-pay | Admitting: Nurse Practitioner

## 2018-09-18 ENCOUNTER — Other Ambulatory Visit: Payer: Self-pay

## 2018-09-18 ENCOUNTER — Ambulatory Visit (INDEPENDENT_AMBULATORY_CARE_PROVIDER_SITE_OTHER): Payer: 59 | Admitting: Nurse Practitioner

## 2018-09-18 DIAGNOSIS — J029 Acute pharyngitis, unspecified: Secondary | ICD-10-CM

## 2018-09-18 MED ORDER — AMOXICILLIN-POT CLAVULANATE 875-125 MG PO TABS: 1.0000 | ORAL_TABLET | Freq: Two times a day (BID) | ORAL | 0 refills | Status: DC

## 2018-09-18 NOTE — Progress Notes (Signed)
Patient ID: Alexandria Ingram, female   DOB: 1960-09-10, 58 y.o.   MRN: [id number]     Virtual Visit via telephone Note  I connected with Alexandria Ingram on 09/18/18 at 12:20 PM by telephone and verified that I am speaking with the correct person using two identifiers. Alexandria Ingram is currently located at home and no one is currently with her during visit. The provider, Mary-Margaret Daphine Deutscher, FNP is located in their office at time of visit.  I discussed the limitations, risks, security and privacy concerns of performing an evaluation and management service by telephone and the availability of in person appointments. I also discussed with the patient that there may be a patient responsible charge related to this service. The patient expressed understanding and agreed to proceed.   History and Present Illness:   Chief Complaint: sore throat  HPI Patient calls in today c/o sore throat that started  Has been hurting for several days. Has had low grade fever. Works for [employer] and cannot be out of work.      Review of Systems  Constitutional: Positive for fever. Negative for chills.  HENT: Positive for congestion, sinus pain and sore throat. Negative for ear pain.   Eyes: Negative.   Respiratory: Negative for cough.   Gastrointestinal: Negative.   Neurological: Positive for headaches.  Psychiatric/Behavioral: Negative.   All other systems reviewed and are negative.    Observations/Objective: Alert and oriented Voice is hoarse  Assessment and Plan: Alexandria Ingram comes in today with chief complaint of Sore Throat   Diagnosis and orders addressed:  1. Pharyngitis, unspecified etiology Force fluids Motrin or tylenol OTC OTC decongestant Throat lozenges if help New toothbrush in 3 days  Meds ordered this encounter  Medications  . amoxicillin-clavulanate (AUGMENTIN) 875-125 MG tablet    Sig: Take 1 tablet by mouth 2 (two) times daily.    Dispense:  14 tablet    Refill:  0   Order Specific Question:   Supervising Provider    Answer:   Arville Care A [[id number]]      Follow Up Instructions: prn    I discussed the assessment and treatment plan with the patient. The patient was provided an opportunity to ask questions and all were answered. The patient agreed with the plan and demonstrated an understanding of the instructions.   The patient was advised to call back or seek an in-person evaluation if the symptoms worsen or if the condition fails to improve as anticipated.  The above assessment and management plan was discussed with the patient. The patient verbalized understanding of and has agreed to the management plan. Patient is aware to call the clinic if symptoms persist or worsen. Patient is aware when to return to the clinic for a follow-up visit. Patient educated on when it is appropriate to go to the emergency department.    I provided 8 minutes of non-face-to-face time during this encounter.    Mary-Margaret Daphine Deutscher, FNP

## 2018-09-23 ENCOUNTER — Ambulatory Visit (INDEPENDENT_AMBULATORY_CARE_PROVIDER_SITE_OTHER): Payer: 59 | Admitting: Physician Assistant

## 2018-09-23 ENCOUNTER — Other Ambulatory Visit: Payer: Self-pay

## 2018-09-23 ENCOUNTER — Encounter: Payer: Self-pay | Admitting: Physician Assistant

## 2018-09-23 DIAGNOSIS — G8929 Other chronic pain: Secondary | ICD-10-CM | POA: Diagnosis not present

## 2018-09-23 DIAGNOSIS — M25562 Pain in left knee: Secondary | ICD-10-CM

## 2018-09-23 NOTE — Progress Notes (Signed)
    Telephone visit  Subjective: CC: Chronic knee pain PCP: Remus Loffler, PA-C QQP:[id number] R Chami is a 58 y.o. female calls for telephone consult today. Patient provides verbal consent for consult held via phone.  Patient is identified with 2 separate identifiers.  At this time the entire area is on COVID-19 social distancing and stay home orders are in place.  Patient is of higher risk and therefore we are performing this by a virtual method.  Location of patient: Home Location of provider: [facility] Others present for call: No  Patient has been having increased left knee pain over the past week.  She has felt it buckle when she went from sitting to standing.  She has taken a step forward with the left leg and felt it lock into place and she had to restart the step.  She did injure that in a hyperextension type movement in November 2019.  She has had off and on problems with it ever since.  In the past some anti-inflammatories has helped but it is still never completely resolved itself.   ROS: Per HPI  Allergies  Allergen Reactions  . Latex Swelling   Past Medical History:  Diagnosis Date  . Allergy   . GERD (gastroesophageal reflux disease)   . Menopause     Current Outpatient Medications:  .  acyclovir cream (ZOVIRAX) 5 %, APPLY TO AFFECTED AREA EVERY 4 HOURS, Disp: 5 g, Rfl: 9 .  betamethasone dipropionate (DIPROLENE) 0.05 % cream, APPLY TO AFFECTED AREA TWICE A DAY, Disp: 60 g, Rfl: 0 .  diclofenac (VOLTAREN) 75 MG EC tablet, Take 1 tablet (75 mg total) by mouth 2 (two) times daily., Disp: 40 tablet, Rfl: 0 .  estradiol (ESTRACE) 2 MG tablet, TAKE 1 TABLET BY MOUTH EVERY DAY, Disp: 90 tablet, Rfl: 1 .  lidocaine (LIDODERM) 5 %, Place 3 patches onto the skin daily. Remove & Discard patch within 12 hours or as directed by MD, Disp: 90 patch, Rfl: 3 .  medroxyPROGESTERone (PROVERA) 5 MG tablet, TAKE 1 TABLET BY MOUTH EVERY DAY, Disp: 90 tablet, Rfl: 0 .  valACYclovir (VALTREX)  1000 MG tablet, Take 1 tablet (1,000 mg total) by mouth 3 (three) times daily., Disp: 270 tablet, Rfl: 0  Assessment/ Plan: 58 y.o. female   1. Chronic pain of left knee Wear brace Ice each day Work on physical therapy exercises Next plan orthopedic referral   Start time: 2:19 PM End time: 2:29 PM  No orders of the defined types were placed in this encounter.   Prudy Feeler PA-C [facility] [phone]

## 2018-09-23 NOTE — Patient Instructions (Signed)
We are looking at the possibility of a meniscal tear or a tear in your cruciate ligament.  These kind of tears can be chronic.  And the fact that you had a mild injury back in November and have continued to be bothered with that makes me think of this type of injury.  We really want to try to let it heal up on its own.  I do want you to try to support it as much as possible with the brace that you are going to buy.  Every evening try to ice it.  I would like for you to google physical therapy for meniscal tear.  And try to do some of the exercises that the physical therapist show on the videos.  If things do not improve the next step is orthopedic referral.

## 2018-09-24 ENCOUNTER — Ambulatory Visit: Payer: 59 | Admitting: Physician Assistant

## 2018-10-01 ENCOUNTER — Ambulatory Visit: Payer: 59 | Admitting: Physician Assistant

## 2018-10-01 ENCOUNTER — Encounter: Payer: Self-pay | Admitting: Physician Assistant

## 2018-10-01 ENCOUNTER — Other Ambulatory Visit: Payer: Self-pay

## 2018-10-01 VITALS — BP 137/76 | HR 85 | Temp 98.3°F | Ht 66.0 in | Wt 153.0 lb

## 2018-10-01 DIAGNOSIS — M25569 Pain in unspecified knee: Secondary | ICD-10-CM | POA: Diagnosis not present

## 2018-10-01 DIAGNOSIS — M25469 Effusion, unspecified knee: Secondary | ICD-10-CM | POA: Diagnosis not present

## 2018-10-01 DIAGNOSIS — M9903 Segmental and somatic dysfunction of lumbar region: Secondary | ICD-10-CM | POA: Diagnosis not present

## 2018-10-01 DIAGNOSIS — S8992XD Unspecified injury of left lower leg, subsequent encounter: Secondary | ICD-10-CM | POA: Diagnosis not present

## 2018-10-01 MED ORDER — DICLOFENAC SODIUM 75 MG PO TBEC: 75.0000 mg | DELAYED_RELEASE_TABLET | Freq: Two times a day (BID) | ORAL | 5 refills | Status: DC

## 2018-10-01 MED ORDER — METHYLPREDNISOLONE ACETATE 80 MG/ML IJ SUSP
80.0000 mg | Freq: Once | INTRAMUSCULAR | Status: AC
  Administered 2018-10-01: 80 mg via INTRAMUSCULAR

## 2018-10-01 MED ORDER — CARISOPRODOL 350 MG PO TABS: 350.0000 mg | ORAL_TABLET | Freq: Four times a day (QID) | ORAL | 0 refills | Status: DC | PRN

## 2018-10-01 NOTE — Progress Notes (Signed)
BP 137/76   Pulse 85   Temp 98.3 F (36.8 C) (Oral)   Ht 5\' 6"  (1.676 m)   Wt 153 lb (69.4 kg)   LMP 04/02/2015   BMI 24.69 kg/m    Subjective:    Patient ID: Alexandria Ingram, female    DOB: Aug 21, 1960, 58 y.o.   MRN: [id number]  HPI: Alexandria Ingram is a 58 y.o. female presenting on 10/01/2018 for Back Pain  This patient has had 2 days of severe low back pain.  It is located over the lower lumbar spine.  She reports having no specific injury.  However she does work job where she has to lift a lot.  Yesterday she was lifting something out of her trunk and felt the tightening in her low back.  There is no radiation of pain going down her legs.  No weakness.  She does not know of any herniated disc problems that she is ever had in her low back.  Past Medical History:  Diagnosis Date  . Allergy   . GERD (gastroesophageal reflux disease)   . Menopause    Relevant past medical, surgical, family and social history reviewed and updated as indicated. Interim medical history since our last visit reviewed. Allergies and medications reviewed and updated. DATA REVIEWED: CHART IN EPIC  Family History reviewed for pertinent findings.  Review of Systems  Constitutional: Negative.   HENT: Negative.   Eyes: Negative.   Respiratory: Negative.   Gastrointestinal: Negative.   Genitourinary: Negative.   Musculoskeletal: Positive for arthralgias, back pain, joint swelling and myalgias. Negative for gait problem.    Allergies as of 10/01/2018      Reactions   Latex Swelling      Medication List       Accurate as of October 01, 2018 10:11 AM. Always use your most recent med list.        acyclovir cream 5 % Commonly known as:  ZOVIRAX APPLY TO AFFECTED AREA EVERY 4 HOURS   betamethasone dipropionate 0.05 % cream Commonly known as:  DIPROLENE APPLY TO AFFECTED AREA TWICE A DAY   carisoprodol 350 MG tablet Commonly known as:  Soma Take 1 tablet (350 mg total) by mouth 4 (four) times  daily as needed for muscle spasms.   diclofenac 75 MG EC tablet Commonly known as:  VOLTAREN Take 1 tablet (75 mg total) by mouth 2 (two) times daily.   estradiol 2 MG tablet Commonly known as:  ESTRACE TAKE 1 TABLET BY MOUTH EVERY DAY   lidocaine 5 % Commonly known as:  Lidoderm Place 3 patches onto the skin daily. Remove & Discard patch within 12 hours or as directed by MD   medroxyPROGESTERone 5 MG tablet Commonly known as:  PROVERA TAKE 1 TABLET BY MOUTH EVERY DAY   valACYclovir 1000 MG tablet Commonly known as:  VALTREX Take 1 tablet (1,000 mg total) by mouth 3 (three) times daily.          Objective:    BP 137/76   Pulse 85   Temp 98.3 F (36.8 C) (Oral)   Ht 5\' 6"  (1.676 m)   Wt 153 lb (69.4 kg)   LMP 04/02/2015   BMI 24.69 kg/m   Allergies  Allergen Reactions  . Latex Swelling    Wt Readings from Last 3 Encounters:  10/01/18 153 lb (69.4 kg)  07/01/18 150 lb 3.2 oz (68.1 kg)  08/16/17 154 lb (69.9 kg)    Physical Exam Constitutional:  Appearance: She is well-developed.  HENT:     Head: Normocephalic and atraumatic.  Eyes:     Pupils: Pupils are equal, round, and reactive to light.  Cardiovascular:     Rate and Rhythm: Normal rate and regular rhythm.     Heart sounds: Normal heart sounds.  Musculoskeletal:     Lumbar back: She exhibits decreased range of motion, tenderness, pain and spasm.       Back:  Skin:    Findings: No rash.  Neurological:     Mental Status: She is alert and oriented to person, place, and time.     Deep Tendon Reflexes: Reflexes are normal and symmetric.  Psychiatric:        Behavior: Behavior normal.        Thought Content: Thought content normal.        Judgment: Judgment normal.     No results found for this or any previous visit.    Assessment & Plan:   1. Somatic dysfunction of lumbar region - methylPREDNISolone acetate (DEPO-MEDROL) injection 80 mg  2. Knee injury, subsequent encounter Continue  medications - diclofenac (VOLTAREN) 75 MG EC tablet; Take 1 tablet (75 mg total) by mouth 2 (two) times daily.  Dispense: 40 tablet; Refill: 5  3. Pain and swelling of knee, unspecified laterality improved - diclofenac (VOLTAREN) 75 MG EC tablet; Take 1 tablet (75 mg total) by mouth 2 (two) times daily.  Dispense: 40 tablet; Refill: 5   Continue all other maintenance medications as listed above.  Follow up plan: Return if symptoms worsen or fail to improve.  Educational handout given for survey  Remus LofflerAngel S. Elleah Hemsley PA-C [facility] [address]  [place], [place] [id number] [phone]   10/01/2018, 10:11 AM

## 2018-10-20 ENCOUNTER — Other Ambulatory Visit: Payer: Self-pay

## 2018-10-20 ENCOUNTER — Ambulatory Visit (INDEPENDENT_AMBULATORY_CARE_PROVIDER_SITE_OTHER): Payer: 59 | Admitting: Family Medicine

## 2018-10-20 ENCOUNTER — Encounter: Payer: Self-pay | Admitting: Family Medicine

## 2018-10-20 DIAGNOSIS — J329 Chronic sinusitis, unspecified: Secondary | ICD-10-CM

## 2018-10-20 NOTE — Progress Notes (Signed)
Virtual Visit via telephone Note Due to COVID-19, visit is conducted virtually and was requested by patient. This visit type was conducted due to national recommendations for restrictions regarding the COVID-19 Pandemic (e.g. social distancing) in an effort to limit this patient's exposure and mitigate transmission in our community. All issues noted in this document were discussed and addressed.  A physical exam was not performed with this format.   I connected with Alexandria Ingram on 10/20/18 at 0800 by telephone and verified that I am speaking with the correct person using two identifiers. Alexandria Ingram is currently located at home and no one is currently with them during visit. The provider, Kari Baars, FNP is located in their office at time of visit.  I discussed the limitations, risks, security and privacy concerns of performing an evaluation and management service by telephone and the availability of in person appointments. I also discussed with the patient that there may be a patient responsible charge related to this service. The patient expressed understanding and agreed to proceed.  Subjective:  Patient ID: Alexandria Ingram, female    DOB: 10-16-1960, 58 y.o.   MRN: [id number]  Chief Complaint:  Sinus Problem and Headache   HPI: Alexandria Ingram is a 58 y.o. female presenting on 10/20/2018 for Sinus Problem and Headache   Pt reports onset of congestion, sinus pressure, and ear pressure on Friday. States she has a headache along with these symptoms. She has tried Mucinex with some relief.   Sinus Problem  This is a new problem. The current episode started in the past 7 days. The problem has been waxing and waning since onset. There has been no fever. Her pain is at a severity of 4/10. The pain is mild. Associated symptoms include congestion, ear pain, headaches and sinus pressure. Pertinent negatives include no chills, coughing, diaphoresis, hoarse voice, neck pain, shortness of  breath, sneezing, sore throat or swollen glands. Treatments tried: Mucinex. The treatment provided mild relief.  Headache   This is a new problem. The current episode started in the past 7 days. The problem occurs intermittently. The problem has been waxing and waning. The pain is located in the frontal region. The pain does not radiate. Quality: pressure. The pain is at a severity of 4/10. The pain is mild. Associated symptoms include ear pain, muscle aches, rhinorrhea and sinus pressure. Pertinent negatives include no abdominal pain, abnormal behavior, anorexia, back pain, blurred vision, coughing, dizziness, drainage, eye pain, eye redness, eye watering, facial sweating, fever, hearing loss, insomnia, loss of balance, nausea, neck pain, numbness, phonophobia, photophobia, scalp tenderness, seizures, sore throat, swollen glands, tingling, tinnitus, visual change, vomiting, weakness or weight loss. Exacerbated by: bending over. She has tried NSAIDs for the symptoms. The treatment provided moderate relief. Her past medical history is significant for migraine headaches.     Relevant past medical, surgical, family, and social history reviewed and updated as indicated.  Allergies and medications reviewed and updated.   Past Medical History:  Diagnosis Date  . Allergy   . GERD (gastroesophageal reflux disease)   . Menopause     History reviewed. No pertinent surgical history.  Social History   Socioeconomic History  . Marital status: Single    Spouse name: Not on file  . Number of children: Not on file  . Years of education: Not on file  . Highest education level: Not on file  Occupational History  . Not on file  Social Needs  . Financial  resource strain: Not on file  . Food insecurity:    Worry: Not on file    Inability: Not on file  . Transportation needs:    Medical: Not on file    Non-medical: Not on file  Tobacco Use  . Smoking status: Never Smoker  . Smokeless tobacco: Never  Used  Substance and Sexual Activity  . Alcohol use: No  . Drug use: No  . Sexual activity: Not on file  Lifestyle  . Physical activity:    Days per week: Not on file    Minutes per session: Not on file  . Stress: Not on file  Relationships  . Social connections:    Talks on phone: Not on file    Gets together: Not on file    Attends religious service: Not on file    Active member of club or organization: Not on file    Attends meetings of clubs or organizations: Not on file    Relationship status: Not on file  . Intimate partner violence:    Fear of current or ex partner: Not on file    Emotionally abused: Not on file    Physically abused: Not on file    Forced sexual activity: Not on file  Other Topics Concern  . Not on file  Social History Narrative  . Not on file    Outpatient Encounter Medications as of 10/20/2018  Medication Sig  . acyclovir cream (ZOVIRAX) 5 % APPLY TO AFFECTED AREA EVERY 4 HOURS  . betamethasone dipropionate (DIPROLENE) 0.05 % cream APPLY TO AFFECTED AREA TWICE A DAY  . carisoprodol (SOMA) 350 MG tablet Take 1 tablet (350 mg total) by mouth 4 (four) times daily as needed for muscle spasms.  . diclofenac (VOLTAREN) 75 MG EC tablet Take 1 tablet (75 mg total) by mouth 2 (two) times daily.  Marland Kitchen estradiol (ESTRACE) 2 MG tablet TAKE 1 TABLET BY MOUTH EVERY DAY  . lidocaine (LIDODERM) 5 % Place 3 patches onto the skin daily. Remove & Discard patch within 12 hours or as directed by MD  . medroxyPROGESTERone (PROVERA) 5 MG tablet TAKE 1 TABLET BY MOUTH EVERY DAY  . valACYclovir (VALTREX) 1000 MG tablet Take 1 tablet (1,000 mg total) by mouth 3 (three) times daily.   No facility-administered encounter medications on file as of 10/20/2018.     Allergies  Allergen Reactions  . Latex Swelling    Review of Systems  Constitutional: Positive for fatigue. Negative for activity change, appetite change, chills, diaphoresis, fever, unexpected weight change and weight  loss.  HENT: Positive for congestion, ear pain, rhinorrhea, sinus pressure and sinus pain. Negative for dental problem, drooling, ear discharge, facial swelling, hearing loss, hoarse voice, mouth sores, nosebleeds, postnasal drip, sneezing, sore throat, tinnitus, trouble swallowing and voice change.   Eyes: Negative for blurred vision, photophobia, pain, discharge, redness, itching and visual disturbance.  Respiratory: Negative for cough, chest tightness and shortness of breath.   Cardiovascular: Negative for chest pain, palpitations and leg swelling.  Gastrointestinal: Negative for abdominal pain, anorexia, nausea and vomiting.  Musculoskeletal: Negative for back pain and neck pain.  Skin: Negative for rash.  Neurological: Positive for headaches. Negative for dizziness, tingling, tremors, seizures, syncope, facial asymmetry, speech difficulty, weakness, light-headedness, numbness and loss of balance.  Psychiatric/Behavioral: Negative for confusion. The patient does not have insomnia.   All other systems reviewed and are negative.        Observations/Objective: No vital signs or physical exam, this was  a telephone or virtual health encounter.  Pt alert and oriented, answers all questions appropriately, and able to speak in full sentences.    Assessment and Plan: Alexandria Ingram was seen today for sinus problem and headache.  Diagnoses and all orders for this visit:  Rhinosinusitis Onset Friday. No fever or chills. No indications for antibiotics at this time. Symptomatic care discussed. Mucinex, Flonase, frequent saline nasal sprays, increase water intake, can trial over the counter sudafed and zyrtec. Report any new or worsening symptoms or if symptoms do not improve in 1 week.     Follow Up Instructions: Return if symptoms worsen or fail to improve.    I discussed the assessment and treatment plan with the patient. The patient was provided an opportunity to ask questions and all were  answered. The patient agreed with the plan and demonstrated an understanding of the instructions.   The patient was advised to call back or seek an in-person evaluation if the symptoms worsen or if the condition fails to improve as anticipated.  The above assessment and management plan was discussed with the patient. The patient verbalized understanding of and has agreed to the management plan. Patient is aware to call the clinic if symptoms persist or worsen. Patient is aware when to return to the clinic for a follow-up visit. Patient educated on when it is appropriate to go to the emergency department.    I provided 15 minutes of non-face-to-face time during this encounter. The call started at 0800. The call ended at 0815. The other time was used for coordination of care.    Kari BaarsMichelle Rakes, FNP-C [facility] [address] [place], [place] [address] [phone]

## 2018-10-23 ENCOUNTER — Other Ambulatory Visit: Payer: Self-pay | Admitting: Nurse Practitioner

## 2018-10-23 ENCOUNTER — Other Ambulatory Visit: Payer: Self-pay | Admitting: Physician Assistant

## 2018-10-23 DIAGNOSIS — J3089 Other allergic rhinitis: Secondary | ICD-10-CM

## 2018-10-24 ENCOUNTER — Telehealth: Payer: Self-pay | Admitting: Physician Assistant

## 2018-10-24 ENCOUNTER — Encounter: Payer: Self-pay | Admitting: Physician Assistant

## 2018-10-24 ENCOUNTER — Other Ambulatory Visit: Payer: Self-pay | Admitting: Family Medicine

## 2018-10-24 DIAGNOSIS — J329 Chronic sinusitis, unspecified: Secondary | ICD-10-CM

## 2018-10-24 MED ORDER — AMOXICILLIN-POT CLAVULANATE 875-125 MG PO TABS: 1.0000 | ORAL_TABLET | Freq: Two times a day (BID) | ORAL | 0 refills | Status: AC

## 2018-10-24 NOTE — Telephone Encounter (Signed)
MyChart Message sent  

## 2018-10-24 NOTE — Telephone Encounter (Signed)
RX sent to [facility]

## 2018-10-28 ENCOUNTER — Telehealth: Payer: Self-pay

## 2018-10-28 NOTE — Telephone Encounter (Signed)
Please check with patient and see what she wants today.

## 2018-10-28 NOTE — Telephone Encounter (Signed)
Patient's insurance has denied payment for Acyclovir 5% cream.  Covered medications are Acyclovir capsules or tablets OR Valacyclovir.

## 2018-10-29 NOTE — Telephone Encounter (Signed)
Left message to call back  

## 2018-11-03 ENCOUNTER — Other Ambulatory Visit: Payer: Self-pay | Admitting: *Deleted

## 2018-11-03 MED ORDER — BETAMETHASONE DIPROPIONATE 0.05 % EX CREA: TOPICAL_CREAM | CUTANEOUS | 0 refills | Status: DC

## 2018-11-03 NOTE — Progress Notes (Signed)
sent 

## 2018-11-11 ENCOUNTER — Other Ambulatory Visit: Payer: Self-pay | Admitting: Physician Assistant

## 2018-11-11 DIAGNOSIS — J3089 Other allergic rhinitis: Secondary | ICD-10-CM

## 2018-11-13 ENCOUNTER — Telehealth: Payer: Self-pay

## 2018-11-13 DIAGNOSIS — B001 Herpesviral vesicular dermatitis: Secondary | ICD-10-CM

## 2018-11-13 MED ORDER — VALACYCLOVIR HCL 1 G PO TABS: 1000.0000 mg | ORAL_TABLET | Freq: Three times a day (TID) | ORAL | 0 refills | Status: DC

## 2018-11-13 NOTE — Telephone Encounter (Signed)
Refill valtrex 

## 2018-11-13 NOTE — Telephone Encounter (Signed)
Patient's insurance company denied payment for Acyclovir 5% cream.  Their formulary choices are:  Acyclovir capsules or tablets Valacyclovir capsules or tablets

## 2018-11-13 NOTE — Telephone Encounter (Signed)
Refill sent.

## 2018-11-18 NOTE — Telephone Encounter (Signed)
Patient was prescribed Acyclovir tablets

## 2018-11-21 ENCOUNTER — Encounter: Payer: Self-pay | Admitting: Physician Assistant

## 2018-11-23 ENCOUNTER — Other Ambulatory Visit: Payer: Self-pay | Admitting: Physician Assistant

## 2018-11-23 MED ORDER — DICLOFENAC SODIUM 1 % TD GEL: 4.0000 g | Freq: Four times a day (QID) | TRANSDERMAL | 11 refills | Status: DC

## 2018-12-04 ENCOUNTER — Other Ambulatory Visit: Payer: Self-pay | Admitting: Physician Assistant

## 2018-12-19 ENCOUNTER — Other Ambulatory Visit: Payer: Self-pay | Admitting: Physician Assistant

## 2019-01-06 ENCOUNTER — Encounter: Payer: Self-pay | Admitting: Family Medicine

## 2019-01-06 ENCOUNTER — Ambulatory Visit (INDEPENDENT_AMBULATORY_CARE_PROVIDER_SITE_OTHER): Payer: Managed Care, Other (non HMO) | Admitting: Family Medicine

## 2019-01-06 DIAGNOSIS — M25562 Pain in left knee: Secondary | ICD-10-CM | POA: Diagnosis not present

## 2019-01-06 DIAGNOSIS — G8929 Other chronic pain: Secondary | ICD-10-CM

## 2019-01-06 DIAGNOSIS — G44211 Episodic tension-type headache, intractable: Secondary | ICD-10-CM | POA: Diagnosis not present

## 2019-01-06 NOTE — Progress Notes (Signed)
Virtual Visit via telephone Note Due to COVID-19 pandemic this visit was conducted virtually. This visit type was conducted due to national recommendations for restrictions regarding the COVID-19 Pandemic (e.g. social distancing, sheltering in place) in an effort to limit this patient's exposure and mitigate transmission in our community. All issues noted in this document were discussed and addressed.  A physical exam was not performed with this format.   I connected with Alexandria Ingram on 01/06/19 at 46 by telephone and verified that I am speaking with the correct person using two identifiers. [name] is currently located at home and family is currently with them during visit. The provider, Monia Pouch, FNP is located in their office at time of visit.  I discussed the limitations, risks, security and privacy concerns of performing an evaluation and management service by telephone and the availability of in person appointments. I also discussed with the patient that there may be a patient responsible charge related to this service. The patient expressed understanding and agreed to proceed.  Subjective:  Patient ID: Alexandria Ingram, female    DOB: 1960-10-14, 58 y.o.   MRN: [id number]  Chief Complaint:  Headache (related to mask use) and Knee Pain   HPI: Alexandria Ingram is a 58 y.o. female presenting on 01/06/2019 for Headache (related to mask use) and Knee Pain   Pt presents today with complaints of daily headaches when at work wearing a mask. States the headaches are getting more frequent and worsening. States she will start the day without a headache. States after wearing the mask for 1-2 hours she develops a tight, pressure-like headache that radiates into her neck. States she has been trying to stay hydrated. States she does have fatigue at times with the headaches. Would like to know what to do to help prevent these headaches. No focal deficits or red flags.  She states she also  continues to have knee pain. She has been using the Voltaren gel and it helps but the pain is still there. Would like referral to ortho.   Headache  This is a recurrent problem. The current episode started more than 1 month ago. The problem occurs daily. The problem has been waxing and waning. The pain is located in the frontal, retro-orbital and occipital region. The pain radiates to the right neck, left neck and upper back. The pain quality is similar to prior headaches. The quality of the pain is described as aching, band-like and squeezing. The pain is at a severity of 4/10. The pain is mild. Associated symptoms include neck pain. Pertinent negatives include no abdominal pain, abnormal behavior, anorexia, back pain, blurred vision, coughing, dizziness, drainage, ear pain, eye pain, eye redness, eye watering, facial sweating, fever, hearing loss, insomnia, loss of balance, muscle aches, nausea, numbness, phonophobia, photophobia, rhinorrhea, scalp tenderness, seizures, sinus pressure, sore throat, swollen glands, tingling, tinnitus, visual change, vomiting, weakness or weight loss. Exacerbated by: mask use. She has tried acetaminophen and cold packs for the symptoms. The treatment provided no relief.  Knee Pain  Pertinent negatives include no numbness or tingling.     Relevant past medical, surgical, family, and social history reviewed and updated as indicated.  Allergies and medications reviewed and updated.   Past Medical History:  Diagnosis Date  . Allergy   . GERD (gastroesophageal reflux disease)   . Menopause     History reviewed. No pertinent surgical history.  Social History   Socioeconomic History  . Marital status: Single  Spouse name: Not on file  . Number of children: Not on file  . Years of education: Not on file  . Highest education level: Not on file  Occupational History  . Not on file  Social Needs  . Financial resource strain: Not on file  . Food insecurity     Worry: Not on file    Inability: Not on file  . Transportation needs    Medical: Not on file    Non-medical: Not on file  Tobacco Use  . Smoking status: Never Smoker  . Smokeless tobacco: Never Used  Substance and Sexual Activity  . Alcohol use: No  . Drug use: No  . Sexual activity: Not on file  Lifestyle  . Physical activity    Days per week: Not on file    Minutes per session: Not on file  . Stress: Not on file  Relationships  . Social Musicianconnections    Talks on phone: Not on file    Gets together: Not on file    Attends religious service: Not on file    Active member of club or organization: Not on file    Attends meetings of clubs or organizations: Not on file    Relationship status: Not on file  . Intimate partner violence    Fear of current or ex partner: Not on file    Emotionally abused: Not on file    Physically abused: Not on file    Forced sexual activity: Not on file  Other Topics Concern  . Not on file  Social History Narrative  . Not on file    Outpatient Encounter Medications as of 01/06/2019  Medication Sig  . acyclovir cream (ZOVIRAX) 5 % APPLY TO AFFECTED AREA EVERY 4 HOURS  . betamethasone dipropionate (DIPROLENE) 0.05 % cream APPLY TO AFFECTED AREA TWICE A DAY  . carisoprodol (SOMA) 350 MG tablet Take 1 tablet (350 mg total) by mouth 4 (four) times daily as needed for muscle spasms.  . diclofenac (VOLTAREN) 75 MG EC tablet Take 1 tablet (75 mg total) by mouth 2 (two) times daily.  . diclofenac sodium (VOLTAREN) 1 % GEL Apply 4 g topically 4 (four) times daily.  Marland Kitchen. estradiol (ESTRACE) 1 MG tablet TAKE 1 TABLET BY MOUTH EVERY DAY  . estradiol (ESTRACE) 2 MG tablet TAKE 1 TABLET BY MOUTH EVERY DAY  . lidocaine (LIDODERM) 5 % Place 3 patches onto the skin daily. Remove & Discard patch within 12 hours or as directed by MD  . medroxyPROGESTERone (PROVERA) 5 MG tablet TAKE 1 TABLET BY MOUTH EVERY DAY  . valACYclovir (VALTREX) 1000 MG tablet Take 1 tablet (1,000  mg total) by mouth 3 (three) times daily.  . [DISCONTINUED] predniSONE (STERAPRED UNI-PAK 21 TAB) 10 MG (21) TBPK tablet USE AS DIRECTED   No facility-administered encounter medications on file as of 01/06/2019.     Allergies  Allergen Reactions  . Latex Swelling    Review of Systems  Constitutional: Positive for fatigue. Negative for activity change, appetite change, chills, diaphoresis, fever, unexpected weight change and weight loss.  HENT: Negative for ear pain, hearing loss, rhinorrhea, sinus pressure, sore throat and tinnitus.   Eyes: Negative for blurred vision, photophobia, pain, redness and visual disturbance.  Respiratory: Negative for cough and chest tightness.   Cardiovascular: Negative for chest pain, palpitations and leg swelling.  Gastrointestinal: Negative for abdominal pain, anorexia, nausea and vomiting.  Genitourinary: Negative for decreased urine volume and difficulty urinating.  Musculoskeletal: Positive for  arthralgias (knee), neck pain and neck stiffness. Negative for back pain, gait problem and myalgias.  Skin: Negative for color change and pallor.  Neurological: Positive for headaches. Negative for dizziness, tingling, tremors, seizures, syncope, facial asymmetry, speech difficulty, weakness, light-headedness, numbness and loss of balance.  Psychiatric/Behavioral: Negative for confusion and sleep disturbance. The patient does not have insomnia.   All other systems reviewed and are negative.        Observations/Objective: No vital signs or physical exam, this was a telephone or virtual health encounter.  Pt alert and oriented, answers all questions appropriately, and able to speak in full sentences.    Assessment and Plan: Alexandria Ingram was seen today for headache and knee pain.  Diagnoses and all orders for this visit:  Intractable episodic tension-type headache Headache related to daily mask use. Symptoms resolve once she removes the mask for a while.   Symptomatic care discussed. Take frequent breaks to remove the mask for a few minutes. Stay well hydrated. Can trial Excedrin Tension headache as it has a small amount of caffeine in it. Report any new or worsening symptoms.   Chronic pain of left knee Ongoing pain in left knee. Has not been icing knee as discussed. Voltaren gel is beneficial. Continue Voltaren gel. RICE therapy discussed. Referral to ortho placed.  -     Ambulatory referral to Orthopedic Surgery     Follow Up Instructions: Return if symptoms worsen or fail to improve.    I discussed the assessment and treatment plan with the patient. The patient was provided an opportunity to ask questions and all were answered. The patient agreed with the plan and demonstrated an understanding of the instructions.   The patient was advised to call back or seek an in-person evaluation if the symptoms worsen or if the condition fails to improve as anticipated.  The above assessment and management plan was discussed with the patient. The patient verbalized understanding of and has agreed to the management plan. Patient is aware to call the clinic if symptoms persist or worsen. Patient is aware when to return to the clinic for a follow-up visit. Patient educated on when it is appropriate to go to the emergency department.    I provided 15 minutes of non-face-to-face time during this encounter. The call started at 1130. The call ended at 1145. The other time was used for coordination of care.    Kari BaarsMichelle Doni Widmer, FNP-C [facility] [address] [place], [place] [address] [phone] 01/06/19

## 2019-01-07 ENCOUNTER — Other Ambulatory Visit: Payer: Self-pay | Admitting: Family Medicine

## 2019-01-07 ENCOUNTER — Encounter: Payer: Self-pay | Admitting: Family Medicine

## 2019-01-07 DIAGNOSIS — G44211 Episodic tension-type headache, intractable: Secondary | ICD-10-CM

## 2019-01-07 MED ORDER — PREDNISONE 20 MG PO TABS: ORAL_TABLET | ORAL | 0 refills | Status: DC

## 2019-01-16 ENCOUNTER — Telehealth: Payer: Self-pay | Admitting: *Deleted

## 2019-01-16 ENCOUNTER — Other Ambulatory Visit: Payer: Self-pay | Admitting: Physician Assistant

## 2019-01-16 NOTE — Telephone Encounter (Signed)
Acyclovir 5% cream not on formulary for pt insurance  MUST USE ACYCLOVIR CAPSULES, ACYCLOVIRTABLETS, VALACYCLOVIR

## 2019-01-18 NOTE — Telephone Encounter (Signed)
Does she just want to pay for it?

## 2019-01-19 DIAGNOSIS — Z0289 Encounter for other administrative examinations: Secondary | ICD-10-CM

## 2019-01-19 NOTE — Telephone Encounter (Signed)
See previous call, will close this encounter.

## 2019-01-20 NOTE — Telephone Encounter (Signed)
Pt called back saying she can't take the pills. So will try for the PA.  Key: Alexandria Ingram

## 2019-01-26 NOTE — Telephone Encounter (Signed)
Prior Auth for Acyclovir 5% Cream-Denied  Appeal Letter written and faxed

## 2019-01-27 NOTE — Telephone Encounter (Signed)
Appeal for Acyclovir 5% cream was denied. Per physician review, current Antivirals plan criteria only allow coverage of acyclovir 5% cream if the pt has tried and had an inadequate treatment response or intolerance to the required number of formulary alternatives (3 in a class with 3 alternatives) or has a contraindication to all the alternatives. Your request was reviewed by an MD Board Certified in [facility] 01/27/2019. I wrote a letter and sent it in (in chart) explaining pt can't swallow the pills, but it was still denied.

## 2019-01-27 NOTE — Telephone Encounter (Signed)
Please call her and see if she can take the oral. I think she just preferred to use the topical?

## 2019-01-30 ENCOUNTER — Telehealth: Payer: Self-pay | Admitting: Physician Assistant

## 2019-01-30 NOTE — Telephone Encounter (Signed)
Requesting PA. Patient states she faxed the paper work she got from the mail service.

## 2019-01-30 NOTE — Telephone Encounter (Addendum)
I have tried calling this pt multiple times and can never get her on the phone. We tried for PA which was denied so I tried for Appeal which was also denied. There isn't anything else I can do. I just tried to call her now and it says person you are trying to reach is unavailable and to try again later.

## 2019-02-02 NOTE — Telephone Encounter (Signed)
Have tried calling pt multiple times, there are multiple calls open regarding this including one in [place] so will close encounter.

## 2019-02-04 ENCOUNTER — Other Ambulatory Visit: Payer: Self-pay | Admitting: Family Medicine

## 2019-02-04 ENCOUNTER — Other Ambulatory Visit: Payer: Self-pay | Admitting: Physician Assistant

## 2019-02-04 ENCOUNTER — Encounter: Payer: Self-pay | Admitting: Physician Assistant

## 2019-02-04 DIAGNOSIS — B001 Herpesviral vesicular dermatitis: Secondary | ICD-10-CM

## 2019-02-04 DIAGNOSIS — G44211 Episodic tension-type headache, intractable: Secondary | ICD-10-CM

## 2019-02-04 MED ORDER — ACYCLOVIR 5 % EX CREA: TOPICAL_CREAM | CUTANEOUS | 9 refills | Status: DC

## 2019-02-12 ENCOUNTER — Encounter: Payer: Self-pay | Admitting: Physician Assistant

## 2019-02-13 ENCOUNTER — Encounter: Payer: Self-pay | Admitting: Physician Assistant

## 2019-02-13 ENCOUNTER — Ambulatory Visit (INDEPENDENT_AMBULATORY_CARE_PROVIDER_SITE_OTHER): Payer: Managed Care, Other (non HMO) | Admitting: Physician Assistant

## 2019-02-13 DIAGNOSIS — G44211 Episodic tension-type headache, intractable: Secondary | ICD-10-CM

## 2019-02-13 DIAGNOSIS — J0110 Acute frontal sinusitis, unspecified: Secondary | ICD-10-CM

## 2019-02-13 DIAGNOSIS — R0981 Nasal congestion: Secondary | ICD-10-CM

## 2019-02-13 MED ORDER — FLUCONAZOLE 150 MG PO TABS: ORAL_TABLET | ORAL | 0 refills | Status: DC

## 2019-02-13 MED ORDER — PREDNISONE 20 MG PO TABS: ORAL_TABLET | ORAL | 0 refills | Status: DC

## 2019-02-13 MED ORDER — AMOXICILLIN 500 MG PO CAPS: 500.0000 mg | ORAL_CAPSULE | Freq: Three times a day (TID) | ORAL | 0 refills | Status: DC

## 2019-02-13 NOTE — Progress Notes (Signed)
Telephone visit  Subjective: WU:[id number]:sinus, congestion PCP: Remus LofflerJones, Pasha Broad S, PA-C JYN:[id number]:Alexandria Ingram is a 58 y.o. female calls for telephone consult today. Patient provides verbal consent for consult held via phone.  Patient is identified with 2 separate identifiers.  At this time the entire area is on COVID-19 social distancing and stay home orders are in place.  Patient is of higher risk and therefore we are performing this by a virtual method.  Location of patient: home Location of provider: HOME Others present for call: no  This patient has had many days of sinus headache and postnasal drainage. There is copious drainage at times. Denies any fever at this time. There has been a history of sinus infections in the past.  No history of sinus surgery. There is cough at night. It has become more prevalent in recent days.  The patient reports that she also gets yeast infections a lot.  And she had started with some perineal itching about 5 days ago.  Over-the-counter has not helped whatsoever.  She has taken Diflucan in the past.  We have discussed that we are going to use an antibiotic for her so therefore it may worsen the yeast infection so we will have her take 1 a week until resolved.   ROS: Per HPI  Allergies  Allergen Reactions  . Latex Swelling   Past Medical History:  Diagnosis Date  . Allergy   . GERD (gastroesophageal reflux disease)   . Menopause     Current Outpatient Medications:  .  acyclovir cream (ZOVIRAX) 5 %, APPLY TO AFFECTED AREA EVERY 4 HOURS, Disp: 5 g, Rfl: 9 .  amoxicillin (AMOXIL) 500 MG capsule, Take 1 capsule (500 mg total) by mouth 3 (three) times daily., Disp: 30 capsule, Rfl: 0 .  betamethasone dipropionate (DIPROLENE) 0.05 % cream, APPLY TO AFFECTED AREA TWICE A DAY, Disp: 60 g, Rfl: 0 .  carisoprodol (SOMA) 350 MG tablet, Take 1 tablet (350 mg total) by mouth 4 (four) times daily as needed for muscle spasms., Disp: 40 tablet, Rfl: 0 .  diclofenac  (VOLTAREN) 75 MG EC tablet, Take 1 tablet (75 mg total) by mouth 2 (two) times daily., Disp: 40 tablet, Rfl: 5 .  diclofenac sodium (VOLTAREN) 1 % GEL, Apply 4 g topically 4 (four) times daily., Disp: 200 g, Rfl: 11 .  estradiol (ESTRACE) 1 MG tablet, TAKE 1 TABLET BY MOUTH EVERY DAY, Disp: 90 tablet, Rfl: 1 .  estradiol (ESTRACE) 2 MG tablet, TAKE 1 TABLET BY MOUTH EVERY DAY, Disp: 90 tablet, Rfl: 1 .  fluconazole (DIFLUCAN) 150 MG tablet, 1 po q week x 4 weeks, Disp: 4 tablet, Rfl: 0 .  lidocaine (LIDODERM) 5 %, Place 3 patches onto the skin daily. Remove & Discard patch within 12 hours or as directed by MD, Disp: 90 patch, Rfl: 3 .  medroxyPROGESTERone (PROVERA) 5 MG tablet, TAKE 1 TABLET BY MOUTH EVERY DAY, Disp: 90 tablet, Rfl: 1 .  predniSONE (DELTASONE) 20 MG tablet, 2 po at sametime daily for 5 days, Disp: 10 tablet, Rfl: 0 .  valACYclovir (VALTREX) 1000 MG tablet, Take 1 tablet (1,000 mg total) by mouth 3 (three) times daily., Disp: 270 tablet, Rfl: 0  Assessment/ Plan: 58 y.o. female   1. Head congestion - amoxicillin (AMOXIL) 500 MG capsule; Take 1 capsule (500 mg total) by mouth 3 (three) times daily.  Dispense: 30 capsule; Refill: 0  2. Intractable episodic tension-type headache - predniSONE (DELTASONE) 20 MG tablet; 2  po at sametime daily for 5 days  Dispense: 10 tablet; Refill: 0  3. Acute non-recurrent frontal sinusitis - fluconazole (DIFLUCAN) 150 MG tablet; 1 po q week x 4 weeks  Dispense: 4 tablet; Refill: 0 - amoxicillin (AMOXIL) 500 MG capsule; Take 1 capsule (500 mg total) by mouth 3 (three) times daily.  Dispense: 30 capsule; Refill: 0 - predniSONE (DELTASONE) 20 MG tablet; 2 po at sametime daily for 5 days  Dispense: 10 tablet; Refill: 0   No follow-ups on file.  Continue all other maintenance medications as listed above.  Start time: 3:40 PM End time: 3:51 PM  Meds ordered this encounter  Medications  . fluconazole (DIFLUCAN) 150 MG tablet    Sig: 1 po q  week x 4 weeks    Dispense:  4 tablet    Refill:  0    Order Specific Question:   Supervising Provider    Answer:   Janora Norlander [[id number]]  . amoxicillin (AMOXIL) 500 MG capsule    Sig: Take 1 capsule (500 mg total) by mouth 3 (three) times daily.    Dispense:  30 capsule    Refill:  0    Order Specific Question:   Supervising Provider    Answer:   Janora Norlander [[id number]]  . predniSONE (DELTASONE) 20 MG tablet    Sig: 2 po at sametime daily for 5 days    Dispense:  10 tablet    Refill:  0    Order Specific Question:   Supervising Provider    Answer:   Janora Norlander [[id number]]    Particia Nearing PA-C [place] [phone]

## 2019-03-06 ENCOUNTER — Other Ambulatory Visit: Payer: Self-pay | Admitting: Physician Assistant

## 2019-03-06 ENCOUNTER — Encounter: Payer: Self-pay | Admitting: Physician Assistant

## 2019-03-06 DIAGNOSIS — J0110 Acute frontal sinusitis, unspecified: Secondary | ICD-10-CM

## 2019-03-06 DIAGNOSIS — R0981 Nasal congestion: Secondary | ICD-10-CM

## 2019-03-06 MED ORDER — AMOXICILLIN 500 MG PO CAPS: 500.0000 mg | ORAL_CAPSULE | Freq: Three times a day (TID) | ORAL | 0 refills | Status: DC

## 2019-03-22 ENCOUNTER — Encounter: Payer: Self-pay | Admitting: Physician Assistant

## 2019-03-23 ENCOUNTER — Other Ambulatory Visit: Payer: Self-pay | Admitting: Physician Assistant

## 2019-03-24 ENCOUNTER — Other Ambulatory Visit: Payer: Self-pay | Admitting: Physician Assistant

## 2019-03-24 DIAGNOSIS — G8929 Other chronic pain: Secondary | ICD-10-CM

## 2019-03-24 MED ORDER — UBRELVY 50 MG PO TABS: 50.0000 mg | ORAL_TABLET | Freq: Every day | ORAL | 0 refills | Status: DC | PRN

## 2019-06-13 ENCOUNTER — Other Ambulatory Visit: Payer: Self-pay | Admitting: Physician Assistant

## 2019-06-13 ENCOUNTER — Encounter: Payer: Self-pay | Admitting: Physician Assistant

## 2019-07-08 ENCOUNTER — Telehealth (INDEPENDENT_AMBULATORY_CARE_PROVIDER_SITE_OTHER): Payer: Managed Care, Other (non HMO) | Admitting: Physician Assistant

## 2019-07-08 ENCOUNTER — Encounter: Payer: Self-pay | Admitting: Physician Assistant

## 2019-07-08 DIAGNOSIS — J0110 Acute frontal sinusitis, unspecified: Secondary | ICD-10-CM | POA: Diagnosis not present

## 2019-07-08 DIAGNOSIS — R0981 Nasal congestion: Secondary | ICD-10-CM

## 2019-07-08 DIAGNOSIS — G44211 Episodic tension-type headache, intractable: Secondary | ICD-10-CM | POA: Diagnosis not present

## 2019-07-08 MED ORDER — FLUTICASONE PROPIONATE 50 MCG/ACT NA SUSP: 2.0000 | Freq: Every day | NASAL | 6 refills | Status: DC

## 2019-07-08 MED ORDER — PREDNISONE 10 MG (21) PO TBPK: ORAL_TABLET | ORAL | 0 refills | Status: DC

## 2019-07-08 MED ORDER — AMOXICILLIN 500 MG PO CAPS: 500.0000 mg | ORAL_CAPSULE | Freq: Three times a day (TID) | ORAL | 0 refills | Status: DC

## 2019-07-08 NOTE — Progress Notes (Signed)
Telephone visit  Subjective: VO:[id number] PCP: Terald Sleeper, PA-C EXH:Alexandria Ingram is a 59 y.o. female calls for telephone consult today. Patient provides verbal consent for consult held via phone.  Patient is identified with 2 separate identifiers.  At this time the entire area is on COVID-19 social distancing and stay home orders are in place.  Patient is of higher risk and therefore we are performing this by a virtual method.  Location of patient: home Location of provider: HOME Others present for call: no  This patient has had many days of sinus headache and postnasal drainage. There is copious drainage at times. Denies any fever at this time. There has been a history of sinus infections in the past.  No history of sinus surgery. There is cough at night. It has become more prevalent in recent days.    ROS: Per HPI  Allergies  Allergen Reactions  . Latex Swelling   Past Medical History:  Diagnosis Date  . Allergy   . GERD (gastroesophageal reflux disease)   . Menopause     Current Outpatient Medications:  .  acyclovir cream (ZOVIRAX) 5 %, APPLY TO AFFECTED AREA EVERY 4 HOURS, Disp: 5 g, Rfl: 9 .  amoxicillin (AMOXIL) 500 MG capsule, Take 1 capsule (500 mg total) by mouth 3 (three) times daily., Disp: 30 capsule, Rfl: 0 .  betamethasone dipropionate 0.05 % cream, APPLY TO AFFECTED AREA TWICE A DAY, Disp: 60 g, Rfl: 0 .  carisoprodol (SOMA) 350 MG tablet, TAKE 1 TABLET BY MOUTH 4 TIMES A DAY AS NEEDED FOR MUSCLE SPASMS, Disp: 40 tablet, Rfl: 1 .  diclofenac (VOLTAREN) 75 MG EC tablet, Take 1 tablet (75 mg total) by mouth 2 (two) times daily., Disp: 40 tablet, Rfl: 5 .  diclofenac sodium (VOLTAREN) 1 % GEL, Apply 4 g topically 4 (four) times daily., Disp: 200 g, Rfl: 11 .  estradiol (ESTRACE) 1 MG tablet, TAKE 1 TABLET BY MOUTH EVERY DAY, Disp: 90 tablet, Rfl: 1 .  estradiol (ESTRACE) 2 MG tablet, TAKE 1 TABLET BY MOUTH EVERY DAY, Disp: 90 tablet, Rfl: 1 .   fluconazole (DIFLUCAN) 150 MG tablet, 1 po q week x 4 weeks, Disp: 4 tablet, Rfl: 0 .  fluticasone (FLONASE) 50 MCG/ACT nasal spray, Place 2 sprays into both nostrils daily., Disp: 16 g, Rfl: 6 .  lidocaine (LIDODERM) 5 %, PLACE 3 PATCHES ONTO THE SKIN DAILY. REMOVE & DISCARD PATCH WITHIN 12 HOURS OR AS DIRECTED BY MD, Disp: 90 patch, Rfl: 3 .  medroxyPROGESTERone (PROVERA) 5 MG tablet, TAKE 1 TABLET BY MOUTH EVERY DAY, Disp: 90 tablet, Rfl: 1 .  predniSONE (STERAPRED UNI-PAK 21 TAB) 10 MG (21) TBPK tablet, As directed x 6 days, Disp: 21 tablet, Rfl: 0 .  Ubrogepant (UBRELVY) 50 MG TABS, Take 50 mg by mouth daily as needed (migraine). Make one month appointment for follow up, Disp: 15 tablet, Rfl: 0 .  valACYclovir (VALTREX) 1000 MG tablet, Take 1 tablet (1,000 mg total) by mouth 3 (three) times daily., Disp: 270 tablet, Rfl: 0  Assessment/ Plan: 59 y.o. female   1. Intractable episodic tension-type headache Ice Avoid constant face mask wearnig  2. Acute non-recurrent frontal sinusitis - amoxicillin (AMOXIL) 500 MG capsule; Take 1 capsule (500 mg total) by mouth 3 (three) times daily.  Dispense: 30 capsule; Refill: 0  3. Head congestion - amoxicillin (AMOXIL) 500 MG capsule; Take 1 capsule (500 mg total) by mouth 3 (three) times daily.  Dispense:  30 capsule; Refill: 0   No follow-ups on file.  Continue all other maintenance medications as listed above.  Start time: 3:30 pm End time: 3:39 pm  Meds ordered this encounter  Medications  . fluticasone (FLONASE) 50 MCG/ACT nasal spray    Sig: Place 2 sprays into both nostrils daily.    Dispense:  16 g    Refill:  6    Order Specific Question:   Supervising Provider    Answer:   Raliegh Ip [[id number]]  . predniSONE (STERAPRED UNI-PAK 21 TAB) 10 MG (21) TBPK tablet    Sig: As directed x 6 days    Dispense:  21 tablet    Refill:  0    Order Specific Question:   Supervising Provider    Answer:   Raliegh Ip [[id number]]    . amoxicillin (AMOXIL) 500 MG capsule    Sig: Take 1 capsule (500 mg total) by mouth 3 (three) times daily.    Dispense:  30 capsule    Refill:  0    Order Specific Question:   Supervising Provider    Answer:   Raliegh Ip [[id number]]    Prudy Feeler PA-C [facility] [phone]

## 2019-07-17 ENCOUNTER — Other Ambulatory Visit: Payer: Self-pay | Admitting: Physician Assistant

## 2019-07-17 ENCOUNTER — Encounter: Payer: Self-pay | Admitting: Physician Assistant

## 2019-07-20 ENCOUNTER — Other Ambulatory Visit: Payer: Self-pay | Admitting: Physician Assistant

## 2019-07-20 DIAGNOSIS — Z9889 Other specified postprocedural states: Secondary | ICD-10-CM

## 2019-07-20 DIAGNOSIS — R1031 Right lower quadrant pain: Secondary | ICD-10-CM

## 2019-07-21 ENCOUNTER — Other Ambulatory Visit: Payer: Self-pay

## 2019-07-21 DIAGNOSIS — R1031 Right lower quadrant pain: Secondary | ICD-10-CM

## 2019-07-21 DIAGNOSIS — Z9889 Other specified postprocedural states: Secondary | ICD-10-CM

## 2019-07-21 NOTE — Telephone Encounter (Signed)
I printed out the order to have signed and will fax it over to [facility] - do I need to do anything else?  The order is in Epic they are not looking in the right place as this order did not drop under imaging for some reason.

## 2019-07-22 ENCOUNTER — Other Ambulatory Visit: Payer: Self-pay | Admitting: Physician Assistant

## 2019-07-22 DIAGNOSIS — R1031 Right lower quadrant pain: Secondary | ICD-10-CM

## 2019-07-23 ENCOUNTER — Other Ambulatory Visit: Payer: Self-pay

## 2019-08-06 ENCOUNTER — Ambulatory Visit (INDEPENDENT_AMBULATORY_CARE_PROVIDER_SITE_OTHER): Payer: 59

## 2019-08-06 ENCOUNTER — Other Ambulatory Visit: Payer: Self-pay

## 2019-08-06 DIAGNOSIS — R1031 Right lower quadrant pain: Secondary | ICD-10-CM

## 2019-08-09 ENCOUNTER — Encounter: Payer: Self-pay | Admitting: Physician Assistant

## 2019-08-13 ENCOUNTER — Encounter: Payer: Self-pay | Admitting: Physician Assistant

## 2019-08-18 ENCOUNTER — Other Ambulatory Visit: Payer: Self-pay | Admitting: Physician Assistant

## 2019-08-22 ENCOUNTER — Other Ambulatory Visit: Payer: Self-pay | Admitting: Physician Assistant

## 2019-08-22 DIAGNOSIS — B001 Herpesviral vesicular dermatitis: Secondary | ICD-10-CM

## 2019-09-23 ENCOUNTER — Telehealth: Payer: Self-pay

## 2019-09-23 NOTE — Telephone Encounter (Signed)
Prior auth initiated through Covermymeds today for Acyclovir 5% cream.   This is a former [facility] pt. PA is under Dettinger.   Dx: fever blister/ B00.1  Alexandria Ingram (Key: [id number])  Your information has been submitted to [employer]. To check for an updated outcome later, reopen this PA request from your dashboard.  If [employer] has not responded to your request within 24 hours, contact [employer] at [phone]. If you think there may be a problem with your PA request, use our live chat feature at the bottom right.

## 2019-09-24 MED ORDER — ACYCLOVIR 5 % EX OINT: 1.0000 "application " | TOPICAL_OINTMENT | CUTANEOUS | 1 refills | Status: DC

## 2019-09-24 NOTE — Telephone Encounter (Signed)
I sent acyclovir ointment for her instead to see if it is covered better.

## 2019-09-24 NOTE — Addendum Note (Signed)
Addended by: Arville Care on: 09/24/2019 01:12 PM   Modules accepted: Orders

## 2019-09-24 NOTE — Telephone Encounter (Signed)
Acyclovir 5% cream DENIED on 09/23/19. Is there something else she can try ?  A [facility] pt ( PA under DR Dettinger)

## 2019-11-11 ENCOUNTER — Other Ambulatory Visit: Payer: Self-pay | Admitting: *Deleted

## 2019-11-11 DIAGNOSIS — G8929 Other chronic pain: Secondary | ICD-10-CM

## 2019-11-11 DIAGNOSIS — M545 Low back pain, unspecified: Secondary | ICD-10-CM

## 2019-11-11 MED ORDER — LIDOCAINE 5 % EX PTCH: 3.0000 | MEDICATED_PATCH | CUTANEOUS | 3 refills | Status: DC

## 2019-11-11 MED ORDER — BETAMETHASONE DIPROPIONATE 0.05 % EX CREA: TOPICAL_CREAM | CUTANEOUS | 0 refills | Status: DC

## 2019-11-11 MED ORDER — PREDNISONE 10 MG (21) PO TBPK: ORAL_TABLET | ORAL | 0 refills | Status: DC

## 2019-12-14 ENCOUNTER — Ambulatory Visit (INDEPENDENT_AMBULATORY_CARE_PROVIDER_SITE_OTHER): Payer: Self-pay | Admitting: Family Medicine

## 2019-12-14 ENCOUNTER — Encounter: Payer: Self-pay | Admitting: Nurse Practitioner

## 2019-12-14 ENCOUNTER — Encounter: Payer: Self-pay | Admitting: Family Medicine

## 2019-12-14 DIAGNOSIS — R519 Headache, unspecified: Secondary | ICD-10-CM

## 2019-12-14 DIAGNOSIS — G43809 Other migraine, not intractable, without status migrainosus: Secondary | ICD-10-CM

## 2019-12-14 MED ORDER — ACYCLOVIR 5 % EX OINT: 1.0000 "application " | TOPICAL_OINTMENT | CUTANEOUS | 1 refills | Status: DC

## 2019-12-14 MED ORDER — TOPIRAMATE 50 MG PO TABS: 50.0000 mg | ORAL_TABLET | Freq: Every evening | ORAL | 1 refills | Status: DC | PRN

## 2019-12-14 MED ORDER — PREDNISONE 10 MG (21) PO TBPK: ORAL_TABLET | ORAL | 0 refills | Status: DC

## 2019-12-14 NOTE — Progress Notes (Signed)
Virtual Visit via telephone Note  I connected with Alexandria Ingram on 12/14/19 at 1257 by telephone and verified that I am speaking with the correct person using two identifiers. Alexandria Ingram is currently located at home and no other people are currently with her during visit. The provider, Elige Radon Pape Parson, MD is located in their office at time of visit.  Call ended at 1307  I discussed the limitations, risks, security and privacy concerns of performing an evaluation and management service by telephone and the availability of in person appointments. I also discussed with the patient that there may be a patient responsible charge related to this service. The patient expressed understanding and agreed to proceed.   History and Present Illness: Patient is calling in for migraines and was previously seeing Prudy Feeler for these.  She lost her husband and the grief has been causing more migraines.  She took [place] and it did not help.  Excedrine migraine helps the most when she could find it.  She also has sinus pressure over both cheekbones this time and is not doing well.  She is feeling like she is in a fog because of the pressure in her head.  She does have a prescription for augmentin but has not started yet.  She was taking aleveD and it is not working as well.   No diagnosis found.  Outpatient Encounter Medications as of 12/14/2019  Medication Sig   acyclovir ointment (ZOVIRAX) 5 % Apply 1 application topically every 3 (three) hours.   amoxicillin (AMOXIL) 500 MG capsule Take 1 capsule (500 mg total) by mouth 3 (three) times daily.   betamethasone dipropionate 0.05 % cream Apply scant amount to effected area BID prn   carisoprodol (SOMA) 350 MG tablet TAKE 1 TABLET BY MOUTH 4 TIMES A DAY AS NEEDED FOR MUSCLE SPASMS   diclofenac (VOLTAREN) 75 MG EC tablet Take 1 tablet (75 mg total) by mouth 2 (two) times daily.   diclofenac sodium (VOLTAREN) 1 % GEL Apply 4 g topically 4 (four)  times daily.   estradiol (ESTRACE) 1 MG tablet TAKE 1 TABLET BY MOUTH EVERY DAY   estradiol (ESTRACE) 2 MG tablet TAKE 1 TABLET BY MOUTH EVERY DAY   fluconazole (DIFLUCAN) 150 MG tablet 1 po q week x 4 weeks   fluticasone (FLONASE) 50 MCG/ACT nasal spray Place 2 sprays into both nostrils daily.   lidocaine (LIDODERM) 5 % Place 3 patches onto the skin daily. Remove & Discard patch within 12 hours or as directed by MD   medroxyPROGESTERone (PROVERA) 5 MG tablet TAKE 1 TABLET BY MOUTH EVERY DAY   predniSONE (STERAPRED UNI-PAK 21 TAB) 10 MG (21) TBPK tablet As directed x 6 days   Ubrogepant (UBRELVY) 50 MG TABS Take 50 mg by mouth daily as needed (migraine). Make one month appointment for follow up   valACYclovir (VALTREX) 1000 MG tablet TAKE 1 TABLET BY MOUTH THREE TIMES A DAY   No facility-administered encounter medications on file as of 12/14/2019.    Review of Systems  Constitutional: Negative for chills and fever.  HENT: Positive for sinus pressure and sinus pain. Negative for congestion, rhinorrhea, sneezing and sore throat.   Eyes: Negative for visual disturbance.  Respiratory: Negative for chest tightness and shortness of breath.   Cardiovascular: Negative for chest pain and leg swelling.  Musculoskeletal: Negative for back pain and gait problem.  Skin: Negative for rash.  Neurological: Positive for headaches. Negative for weakness, light-headedness and numbness.  Psychiatric/Behavioral: Negative for agitation and behavioral problems.  All other systems reviewed and are negative.   Observations/Objective: Patient sounds comfortable and in no acute distress  Assessment and Plan: Problem List Items Addressed This Visit      Cardiovascular and Mediastinum   Migraine - Primary   Relevant Medications   topiramate (TOPAMAX) 50 MG tablet   predniSONE (STERAPRED UNI-PAK 21 TAB) 10 MG (21) TBPK tablet    Other Visit Diagnoses    Sinus headache       Relevant Medications    topiramate (TOPAMAX) 50 MG tablet   predniSONE (STERAPRED UNI-PAK 21 TAB) 10 MG (21) TBPK tablet      Will treat current headache and give topamax for prevention Follow up plan: Return if symptoms worsen or fail to improve.     I discussed the assessment and treatment plan with the patient. The patient was provided an opportunity to ask questions and all were answered. The patient agreed with the plan and demonstrated an understanding of the instructions.   The patient was advised to call back or seek an in-person evaluation if the symptoms worsen or if the condition fails to improve as anticipated.  The above assessment and management plan was discussed with the patient. The patient verbalized understanding of and has agreed to the management plan. Patient is aware to call the clinic if symptoms persist or worsen. Patient is aware when to return to the clinic for a follow-up visit. Patient educated on when it is appropriate to go to the emergency department.    I provided 10 minutes of non-face-to-face time during this encounter.    Nils Pyle, MD

## 2019-12-15 ENCOUNTER — Other Ambulatory Visit: Payer: Self-pay | Admitting: *Deleted

## 2019-12-15 DIAGNOSIS — G43809 Other migraine, not intractable, without status migrainosus: Secondary | ICD-10-CM

## 2019-12-15 DIAGNOSIS — R519 Headache, unspecified: Secondary | ICD-10-CM

## 2019-12-15 NOTE — Progress Notes (Signed)
We have a receipt from the [facility] [address] that 3 meds were received by them yesterday between 1:07-1:08 pm. Pt to call and check again- they should be there. We receive the receipt, one they go over successfully-electronically.

## 2020-01-06 ENCOUNTER — Other Ambulatory Visit: Payer: Self-pay | Admitting: Family Medicine

## 2020-01-06 DIAGNOSIS — G43809 Other migraine, not intractable, without status migrainosus: Secondary | ICD-10-CM

## 2020-01-06 DIAGNOSIS — R519 Headache, unspecified: Secondary | ICD-10-CM

## 2020-01-18 ENCOUNTER — Other Ambulatory Visit: Payer: Self-pay | Admitting: *Deleted

## 2020-01-18 MED ORDER — MEDROXYPROGESTERONE ACETATE 5 MG PO TABS: 5.0000 mg | ORAL_TABLET | Freq: Every day | ORAL | 0 refills | Status: DC

## 2020-02-11 ENCOUNTER — Encounter: Payer: Self-pay | Admitting: Nurse Practitioner

## 2020-02-12 ENCOUNTER — Ambulatory Visit: Payer: 59 | Admitting: Family Medicine

## 2020-02-12 ENCOUNTER — Telehealth: Payer: Self-pay | Admitting: Nurse Practitioner

## 2020-02-12 NOTE — Telephone Encounter (Signed)
No NTBS 

## 2020-02-12 NOTE — Telephone Encounter (Signed)
Attempted to contact patient - NA °

## 2020-02-15 ENCOUNTER — Ambulatory Visit (INDEPENDENT_AMBULATORY_CARE_PROVIDER_SITE_OTHER): Payer: 59 | Admitting: Nurse Practitioner

## 2020-02-15 DIAGNOSIS — H6613 Chronic tubotympanic suppurative otitis media, bilateral: Secondary | ICD-10-CM

## 2020-02-15 NOTE — Progress Notes (Signed)
   Virtual Visit via telephone Note Due to COVID-19 pandemic this visit was conducted virtually. This visit type was conducted due to national recommendations for restrictions regarding the COVID-19 Pandemic (e.g. social distancing, sheltering in place) in an effort to limit this patient's exposure and mitigate transmission in our community. All issues noted in this document were discussed and addressed.  A physical exam was not performed with this format.  I connected with Alexandria Ingram on 02/15/20 at 9:40 by telephone and verified that I am speaking with the correct person using two identifiers. Alexandria Ingram is currently located at home and no one is currently with her during visit. The provider, Mary-Margaret Daphine Deutscher, FNP is located in their office at time of visit.  I discussed the limitations, risks, security and privacy concerns of performing an evaluation and management service by telephone and the availability of in person appointments. I also discussed with the patient that there may be a patient responsible charge related to this service. The patient expressed understanding and agreed to proceed.   History and Present Illness:   Chief Complaint: Ear Pain   HPI Calls in c/o pressures in both ears. Started out 1 week ago in right ear and now is in both ears. She took some mucinex d which did  Not really help that much. Denies drainage, no cough or congestion.    Review of Systems  Constitutional: Negative for chills and fever.  HENT: Positive for ear pain. Negative for congestion, ear discharge and sore throat.   Respiratory: Negative for cough.   Neurological: Negative for dizziness and headaches.  All other systems reviewed and are negative.    Observations/Objective: Alert and oriented- answers all questions appropriately No distress Mild hoarseness   Assessment and Plan: Alexandria Ingram in today with chief complaint of Ear Pain   1. 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 The patient was provided an opportunity to ask questions and all were answered. The patient agreed with the plan and demonstrated an understanding of the instructions.   The patient was advised to call back or seek an in-person evaluation if the symptoms worsen or if the condition fails to improve as anticipated.  The above assessment and management plan was discussed with the patient. The patient verbalized understanding of and has agreed to the management plan. Patient is aware to call the clinic if symptoms persist or worsen. Patient is aware when to return to the clinic for a follow-up visit. Patient educated on when it is appropriate to go to the emergency department.   Time call ended:  9:55  I provided 15 minutes of non-face-to-face time during this encounter.    Mary-Margaret Daphine Deutscher, FNP

## 2020-02-19 ENCOUNTER — Telehealth: Payer: Self-pay | Admitting: Nurse Practitioner

## 2020-02-19 NOTE — Telephone Encounter (Signed)
Employee should have an exemption form to fill  out.

## 2020-02-22 NOTE — Telephone Encounter (Signed)
Sent Mychart message to patient per her request

## 2020-03-10 ENCOUNTER — Encounter: Payer: Self-pay | Admitting: Nurse Practitioner

## 2020-03-10 ENCOUNTER — Ambulatory Visit (INDEPENDENT_AMBULATORY_CARE_PROVIDER_SITE_OTHER): Payer: 59 | Admitting: Nurse Practitioner

## 2020-03-10 DIAGNOSIS — J020 Streptococcal pharyngitis: Secondary | ICD-10-CM | POA: Diagnosis not present

## 2020-03-10 MED ORDER — AMOXICILLIN-POT CLAVULANATE 875-125 MG PO TABS
1.0000 | ORAL_TABLET | Freq: Two times a day (BID) | ORAL | 0 refills | Status: DC
Start: 2020-03-10 — End: 2020-03-28

## 2020-03-10 NOTE — Progress Notes (Signed)
° °  Virtual Visit via telephone Note Due to COVID-19 pandemic this visit was conducted virtually. This visit type was conducted due to national recommendations for restrictions regarding the COVID-19 Pandemic (e.g. social distancing, sheltering in place) in an effort to limit this patient's exposure and mitigate transmission in our community. All issues noted in this document were discussed and addressed.  A physical exam was not performed with this format.  I connected with Alexandria Ingram on 03/10/20 at 9:10 by telephone and verified that I am speaking with the correct person using two identifiers. Alexandria Ingram is currently located at work and students are currently with her during visit. The provider, Mary-Margaret Daphine Deutscher, FNP is located in their office at time of visit.  I discussed the limitations, risks, security and privacy concerns of performing an evaluation and management service by telephone and the availability of in person appointments. I also discussed with the patient that there may be a patient responsible charge related to this service. The patient expressed understanding and agreed to proceed.   History and Present Illness:   Chief Complaint: Sore Throat   HPI Patient calls in c/o sore throat for 6 days. Has been taking alka seltzerplus and no relief. When she swallows it feels like razor blade. She denies and blisters but throat is red.   Review of Systems  Constitutional: Negative for chills and fever.  HENT: Positive for congestion and sore throat. Negative for ear pain.   Respiratory: Negative for cough.   Genitourinary: Negative.   Neurological: Negative for dizziness and headaches (slight).  Psychiatric/Behavioral: Negative.   All other systems reviewed and are negative.    Observations/Objective: Alert and oriented- answers all questions appropriately No distress Voice hoarse * patient describes throat as red  Assessment and Plan: Alexandria Ingram in today  with chief complaint of Sore Throat   1. Pharyngitis due to Streptococcus species Force fluids Motrin or tylenol OTC OTC decongestant Throat lozenges if help New toothbrush in 3 days  Meds ordered this encounter  Medications   amoxicillin-clavulanate (AUGMENTIN) 875-125 MG tablet    Sig: Take 1 tablet by mouth 2 (two) times daily.    Dispense:  14 tablet    Refill:  0    Order Specific Question:   Supervising Provider    Answer:   Arville Care A [[id number]]     Follow Up Instructions: prn    I discussed the assessment and treatment plan with the patient. The patient was provided an opportunity to ask questions and all were answered. The patient agreed with the plan and demonstrated an understanding of the instructions.   The patient was advised to call back or seek an in-person evaluation if the symptoms worsen or if the condition fails to improve as anticipated.  The above assessment and management plan was discussed with the patient. The patient verbalized understanding of and has agreed to the management plan. Patient is aware to call the clinic if symptoms persist or worsen. Patient is aware when to return to the clinic for a follow-up visit. Patient educated on when it is appropriate to go to the emergency department.   Time call ended:  9:25  I provided 15 minutes of non-face-to-face time during this encounter.    Mary-Margaret Daphine Deutscher, FNP

## 2020-03-28 ENCOUNTER — Encounter: Payer: Self-pay | Admitting: Nurse Practitioner

## 2020-03-28 ENCOUNTER — Ambulatory Visit (INDEPENDENT_AMBULATORY_CARE_PROVIDER_SITE_OTHER): Payer: 59 | Admitting: Nurse Practitioner

## 2020-03-28 ENCOUNTER — Other Ambulatory Visit: Payer: Self-pay

## 2020-03-28 VITALS — BP 147/87 | HR 78 | Temp 97.9°F | Resp 20 | Ht 66.0 in | Wt 143.0 lb

## 2020-03-28 DIAGNOSIS — G43809 Other migraine, not intractable, without status migrainosus: Secondary | ICD-10-CM

## 2020-03-28 DIAGNOSIS — Z0001 Encounter for general adult medical examination with abnormal findings: Secondary | ICD-10-CM | POA: Diagnosis not present

## 2020-03-28 DIAGNOSIS — E0500 Thyrotoxicosis with diffuse goiter without thyrotoxic crisis or storm: Secondary | ICD-10-CM | POA: Diagnosis not present

## 2020-03-28 DIAGNOSIS — Z9289 Personal history of other medical treatment: Secondary | ICD-10-CM

## 2020-03-28 DIAGNOSIS — K219 Gastro-esophageal reflux disease without esophagitis: Secondary | ICD-10-CM | POA: Diagnosis not present

## 2020-03-28 DIAGNOSIS — Z0000 Encounter for general adult medical examination without abnormal findings: Secondary | ICD-10-CM

## 2020-03-28 NOTE — Patient Instructions (Signed)

## 2020-03-28 NOTE — Progress Notes (Addendum)
Subjective:    Patient ID: Alexandria Ingram, female    DOB: December 18, 1960, 59 y.o.   MRN: [id number]   Chief Complaint: annual physical   HPI:  1. Other migraine without status migrainosus, not intractable Takes medication as prescribed. Last migraine was in August, states she does not get them frequently now that she is taking Topamax.   2. Gastroesophageal reflux disease without esophagitis Takes medication as prescribed, enjoys spicy foods. Denies any complaints at this time.   3. Graves disease Does not follow endo in over 7 years, does not take thyroid medication, does not have related symptoms.      Outpatient Encounter Medications as of 03/28/2020  Medication Sig  . acyclovir ointment (ZOVIRAX) 5 % Apply 1 application topically every 3 (three) hours.  Marland Kitchen amoxicillin (AMOXIL) 500 MG capsule Take 1 capsule (500 mg total) by mouth 3 (three) times daily.  Marland Kitchen amoxicillin-clavulanate (AUGMENTIN) 875-125 MG tablet Take 1 tablet by mouth 2 (two) times daily.  . betamethasone dipropionate 0.05 % cream Apply scant amount to effected area BID prn  . carisoprodol (SOMA) 350 MG tablet TAKE 1 TABLET BY MOUTH 4 TIMES A DAY AS NEEDED FOR MUSCLE SPASMS  . diclofenac (VOLTAREN) 75 MG EC tablet Take 1 tablet (75 mg total) by mouth 2 (two) times daily.  . diclofenac sodium (VOLTAREN) 1 % GEL Apply 4 g topically 4 (four) times daily.  Marland Kitchen estradiol (ESTRACE) 1 MG tablet TAKE 1 TABLET BY MOUTH EVERY DAY  . estradiol (ESTRACE) 2 MG tablet TAKE 1 TABLET BY MOUTH EVERY DAY  . fluconazole (DIFLUCAN) 150 MG tablet 1 po q week x 4 weeks  . fluticasone (FLONASE) 50 MCG/ACT nasal spray Place 2 sprays into both nostrils daily.  Marland Kitchen lidocaine (LIDODERM) 5 % Place 3 patches onto the skin daily. Remove & Discard patch within 12 hours or as directed by MD  . medroxyPROGESTERone (PROVERA) 5 MG tablet Take 1 tablet (5 mg total) by mouth daily. (Needs to be seen before next refill)  . predniSONE (STERAPRED UNI-PAK 21  TAB) 10 MG (21) TBPK tablet As directed x 6 days  . topiramate (TOPAMAX) 50 MG tablet Take 1 tablet (50 mg total) by mouth at bedtime as needed.  Marland Kitchen Ubrogepant (UBRELVY) 50 MG TABS Take 50 mg by mouth daily as needed (migraine). Make one month appointment for follow up  . valACYclovir (VALTREX) 1000 MG tablet TAKE 1 TABLET BY MOUTH THREE TIMES A DAY   No facility-administered encounter medications on file as of 03/28/2020.    History reviewed. No pertinent surgical history.  History reviewed. No pertinent family history.  New complaints: Vaginal discharge that looked like end of period ~1 month ago, increased stress due to loss of husband. Has not had regular periods since 2010, is having hot/cold flashes.   Social history: Lives at home with mom, plays chess and with dogs.   Controlled substance contract: n/a    Review of Systems  Constitutional: Negative.   HENT: Negative.   Eyes: Negative.   Respiratory: Negative.   Cardiovascular: Negative.   Gastrointestinal: Negative.   Endocrine: Positive for cold intolerance and heat intolerance.  Genitourinary: Negative.   Musculoskeletal: Negative.   Skin: Negative.   Allergic/Immunologic: Negative.   Neurological: Negative.   Hematological: Negative.   Psychiatric/Behavioral: Negative.   All other systems reviewed and are negative.      Objective:   Physical Exam Vitals and nursing note reviewed.  Constitutional:      Appearance:  Normal appearance.  HENT:     Head: Normocephalic and atraumatic.     Right Ear: Tympanic membrane, ear canal and external ear normal.     Left Ear: Tympanic membrane, ear canal and external ear normal.     Nose: Nose normal.     Mouth/Throat:     Mouth: Mucous membranes are moist.     Pharynx: Oropharynx is clear.  Eyes:     Extraocular Movements: Extraocular movements intact.     Conjunctiva/sclera: Conjunctivae normal.     Pupils: Pupils are equal, round, and reactive to light.    Cardiovascular:     Rate and Rhythm: Normal rate and regular rhythm.     Pulses: Normal pulses.     Heart sounds: Normal heart sounds.  Pulmonary:     Effort: Pulmonary effort is normal.     Breath sounds: Normal breath sounds.  Abdominal:     General: Abdomen is flat. Bowel sounds are normal.     Palpations: Abdomen is soft.  Genitourinary:    General: Normal vulva.     Vagina: No vaginal discharge.     Rectum: Normal.     Comments: Cervix parous and oink No adnexal masses or tenderness Musculoskeletal:        General: Normal range of motion.     Cervical back: Normal range of motion.  Skin:    General: Skin is warm and dry.     Capillary Refill: Capillary refill takes less than 2 seconds.  Neurological:     General: No focal deficit present.     Mental Status: She is alert and oriented to person, place, and time. Mental status is at baseline.  Psychiatric:        Mood and Affect: Mood normal.        Behavior: Behavior normal.        Thought Content: Thought content normal.        Judgment: Judgment normal.    BP (!) 147/87   Pulse 78   Temp 97.9 F (36.6 C) (Temporal)   Resp 20   Ht 5\' 6"  (1.676 m)   Wt 143 lb (64.9 kg)   LMP 04/02/2015   SpO2 98%   BMI 23.08 kg/m       Assessment & Plan:  Alexandria Ingram comes in today with chief complaint of Annual Exam   Diagnosis and orders addressed:  1. Annual physical Patient works for [employer] so Alexandria Ingram her papa bottle to take to Social worker will e drawn at [employer]  2. Other migraine without status migrainosus, not intractable Take medication as prescribed. Report any new or worsening symptoms.   3. Gastroesophageal reflux disease without esophagitis Take medication as prescribed. Avoid trigger foods. Eat smaller meals more frequently verses large meals.   4. Graves disease Report any new or worsening symptoms.    Labs pending Health Maintenance reviewed Diet and exercise encouraged  Follow up  plan: Follow up in 1 year.    Mary-Margaret Avery Dennison, FNP

## 2020-04-04 ENCOUNTER — Other Ambulatory Visit: Payer: Self-pay | Admitting: *Deleted

## 2020-04-04 MED ORDER — ESTRADIOL 1 MG PO TABS
1.0000 mg | ORAL_TABLET | Freq: Every day | ORAL | 3 refills | Status: DC
Start: 2020-04-04 — End: 2021-05-01

## 2020-04-05 ENCOUNTER — Ambulatory Visit: Payer: 59 | Admitting: Nurse Practitioner

## 2020-04-25 ENCOUNTER — Other Ambulatory Visit: Payer: Self-pay | Admitting: Nurse Practitioner

## 2020-05-05 ENCOUNTER — Other Ambulatory Visit: Payer: Self-pay | Admitting: *Deleted

## 2020-05-06 ENCOUNTER — Other Ambulatory Visit: Payer: Self-pay | Admitting: Nurse Practitioner

## 2020-05-06 DIAGNOSIS — Z1231 Encounter for screening mammogram for malignant neoplasm of breast: Secondary | ICD-10-CM

## 2020-05-07 MED ORDER — CARISOPRODOL 350 MG PO TABS: ORAL_TABLET | ORAL | 0 refills | Status: DC

## 2020-05-09 ENCOUNTER — Other Ambulatory Visit: Payer: Self-pay

## 2020-05-09 ENCOUNTER — Ambulatory Visit (INDEPENDENT_AMBULATORY_CARE_PROVIDER_SITE_OTHER): Payer: 59 | Admitting: Nurse Practitioner

## 2020-05-09 ENCOUNTER — Encounter: Payer: Self-pay | Admitting: Nurse Practitioner

## 2020-05-09 DIAGNOSIS — G8929 Other chronic pain: Secondary | ICD-10-CM | POA: Diagnosis not present

## 2020-05-09 DIAGNOSIS — M545 Low back pain, unspecified: Secondary | ICD-10-CM | POA: Diagnosis not present

## 2020-05-09 MED ORDER — CARISOPRODOL 350 MG PO TABS: ORAL_TABLET | ORAL | 0 refills | Status: DC

## 2020-05-09 NOTE — Progress Notes (Signed)
Virtual Visit via telephone Note Due to COVID-19 pandemic this visit was conducted virtually. This visit type was conducted due to national recommendations for restrictions regarding the COVID-19 Pandemic (e.g. social distancing, sheltering in place) in an effort to limit this patient's exposure and mitigate transmission in our community. All issues noted in this document were discussed and addressed.  A physical exam was not performed with this format.  I connected with Alexandria Ingram on 05/09/20 at 2:50 by telephone and verified that I am speaking with the correct person using two identifiers. Alexandria Ingram is currently located at work and no one is currently with her during visit. The provider, Mary-Margaret Daphine Deutscher, FNP is located in their office at time of visit.  I discussed the limitations, risks, security and privacy concerns of performing an evaluation and management service by telephone and the availability of in person appointments. I also discussed with the patient that there may be a patient responsible charge related to this service. The patient expressed understanding and agreed to proceed.   History and Present Illness:   Chief Complaint: Back Pain   HPI Patient has been having back pain for several years. She takes Herbalist as needed and is running out. She is traveling for her job and cannot come into the office. She has intermittent back pain and riding in the car increases her pain. Rates pain 5-10/10 at times.    Review of Systems  Constitutional: Negative for diaphoresis and weight loss.  Eyes: Negative for blurred vision, double vision and pain.  Respiratory: Negative for shortness of breath.   Cardiovascular: Negative for chest pain, palpitations, orthopnea and leg swelling.  Gastrointestinal: Negative for abdominal pain.  Skin: Negative for rash.  Neurological: Negative for dizziness, sensory change, loss of consciousness, weakness and headaches.    Endo/Heme/Allergies: Negative for polydipsia. Does not bruise/bleed easily.  Psychiatric/Behavioral: Negative for memory loss. The patient does not have insomnia.   All other systems reviewed and are negative.    Observations/Objective: Alert and oriented- answers all questions appropriately No distress    Assessment and Plan: Alexandria Ingram in today with chief complaint of Back Pain   1. Chronic midline low back pain, unspecified whether sciatica present Moist heat Rest Meds ordered this encounter  Medications  . carisoprodol (SOMA) 350 MG tablet    Sig: TAKE 1 TABLET BY MOUTH 4 TIMES A DAY AS NEEDED FOR MUSCLE SPASMS    Dispense:  40 tablet    Refill:  0    This request is for a new prescription for a controlled substance as required by Federal/State law.    Order Specific Question:   Supervising Provider    Answer:   Nils Pyle [[id number]]    Needs face to face visit for next refill    Follow Up Instructions: In 1 month for soma refill    I discussed the assessment and treatment plan with the patient. The patient was provided an opportunity to ask questions and all were answered. The patient agreed with the plan and demonstrated an understanding of the instructions.   The patient was advised to call back or seek an in-person evaluation if the symptoms worsen or if the condition fails to improve as anticipated.  The above assessment and management plan was discussed with the patient. The patient verbalized understanding of and has agreed to the management plan. Patient is aware to call the clinic if symptoms persist or worsen. Patient is aware when to return  to the clinic for a follow-up visit. Patient educated on when it is appropriate to go to the emergency department.   Time call ended:  3:02  I provided 12 minutes of non-face-to-face time during this encounter.    Mary-Margaret Daphine Deutscher, FNP

## 2020-05-09 NOTE — Telephone Encounter (Signed)
Erroneous encounter

## 2020-05-18 ENCOUNTER — Ambulatory Visit (INDEPENDENT_AMBULATORY_CARE_PROVIDER_SITE_OTHER): Payer: 59

## 2020-05-18 ENCOUNTER — Other Ambulatory Visit: Payer: Self-pay

## 2020-05-18 DIAGNOSIS — Z1231 Encounter for screening mammogram for malignant neoplasm of breast: Secondary | ICD-10-CM | POA: Diagnosis not present

## 2020-05-24 ENCOUNTER — Other Ambulatory Visit: Payer: Self-pay | Admitting: Nurse Practitioner

## 2020-05-24 DIAGNOSIS — R928 Other abnormal and inconclusive findings on diagnostic imaging of breast: Secondary | ICD-10-CM

## 2020-06-06 ENCOUNTER — Ambulatory Visit: Payer: 59 | Admitting: Nurse Practitioner

## 2020-06-09 ENCOUNTER — Other Ambulatory Visit: Payer: Self-pay

## 2020-06-09 ENCOUNTER — Ambulatory Visit
Admission: RE | Admit: 2020-06-09 | Discharge: 2020-06-09 | Disposition: A | Discharge status: Home / Self Care | Payer: 59 | Source: Ambulatory Visit | Attending: Nurse Practitioner | Admitting: Nurse Practitioner

## 2020-06-09 ENCOUNTER — Ambulatory Visit: Payer: 59

## 2020-06-09 DIAGNOSIS — R928 Other abnormal and inconclusive findings on diagnostic imaging of breast: Secondary | ICD-10-CM

## 2020-06-13 ENCOUNTER — Other Ambulatory Visit: Payer: Self-pay | Admitting: Nurse Practitioner

## 2020-06-16 ENCOUNTER — Ambulatory Visit: Payer: 59

## 2020-06-28 ENCOUNTER — Other Ambulatory Visit: Payer: Self-pay | Admitting: Nurse Practitioner

## 2020-07-04 ENCOUNTER — Ambulatory Visit: Payer: 59 | Admitting: Nurse Practitioner

## 2020-07-04 ENCOUNTER — Encounter: Payer: Self-pay | Admitting: Nurse Practitioner

## 2020-07-04 ENCOUNTER — Other Ambulatory Visit: Payer: Self-pay

## 2020-07-04 VITALS — BP 132/84 | HR 89 | Temp 97.9°F | Resp 20 | Ht 66.0 in | Wt 148.0 lb

## 2020-07-04 DIAGNOSIS — K219 Gastro-esophageal reflux disease without esophagitis: Secondary | ICD-10-CM

## 2020-07-04 DIAGNOSIS — F339 Major depressive disorder, recurrent, unspecified: Secondary | ICD-10-CM

## 2020-07-04 DIAGNOSIS — R519 Headache, unspecified: Secondary | ICD-10-CM

## 2020-07-04 DIAGNOSIS — E0500 Thyrotoxicosis with diffuse goiter without thyrotoxic crisis or storm: Secondary | ICD-10-CM

## 2020-07-04 DIAGNOSIS — G43809 Other migraine, not intractable, without status migrainosus: Secondary | ICD-10-CM

## 2020-07-04 DIAGNOSIS — B001 Herpesviral vesicular dermatitis: Secondary | ICD-10-CM

## 2020-07-04 DIAGNOSIS — M545 Low back pain, unspecified: Secondary | ICD-10-CM

## 2020-07-04 MED ORDER — TOPIRAMATE 50 MG PO TABS: 50.0000 mg | ORAL_TABLET | Freq: Every evening | ORAL | 1 refills | Status: DC | PRN

## 2020-07-04 MED ORDER — CARISOPRODOL 350 MG PO TABS: ORAL_TABLET | ORAL | 0 refills | Status: DC

## 2020-07-04 MED ORDER — LIDOCAINE 5 % EX PTCH: 1.0000 | MEDICATED_PATCH | CUTANEOUS | 1 refills | Status: DC

## 2020-07-04 NOTE — Patient Instructions (Signed)
Acute Back Pain, Adult Acute back pain is sudden and usually short-lived. It is often caused by an injury to the muscles and tissues in the back. The injury may result from:  A muscle or ligament getting overstretched or torn (strained). Ligaments are tissues that connect bones to each other. Lifting something improperly can cause a back strain.  Wear and tear (degeneration) of the spinal disks. Spinal disks are circular tissue that provide cushioning between the bones of the spine (vertebrae).  Twisting motions, such as while playing sports or doing yard work.  A hit to the back.  Arthritis. You may have a physical exam, lab tests, and imaging tests to find the cause of your pain. Acute back pain usually goes away with rest and home care. Follow these instructions at home: Managing pain, stiffness, and swelling  Treatment may include medicines for pain and inflammation that are taken by mouth or applied to the skin, prescription pain medicine, or muscle relaxants. Take over-the-counter and prescription medicines only as told by your health care provider.  Your health care provider may recommend applying ice during the first 24-48 hours after your pain starts. To do this: ? Put ice in a plastic bag. ? Place a towel between your skin and the bag. ? Leave the ice on for 20 minutes, 2-3 times a day.  If directed, apply heat to the affected area as often as told by your health care provider. Use the heat source that your health care provider recommends, such as a moist heat pack or a heating pad. ? Place a towel between your skin and the heat source. ? Leave the heat on for 20-30 minutes. ? Remove the heat if your skin turns bright red. This is especially important if you are unable to feel pain, heat, or cold. You have a greater risk of getting burned. Activity  Do not stay in bed. Staying in bed for more than 1-2 days can delay your recovery.  Sit up and stand up straight. Avoid leaning  forward when you sit or hunching over when you stand. ? If you work at a desk, sit close to it so you do not need to lean over. Keep your chin tucked in. Keep your neck drawn back, and keep your elbows bent at a 90-degree angle (right angle). ? Sit high and close to the steering wheel when you drive. Add lower back (lumbar) support to your car seat, if needed.  Take short walks on even surfaces as soon as you are able. Try to increase the length of time you walk each day.  Do not sit, drive, or stand in one place for more than 30 minutes at a time. Sitting or standing for long periods of time can put stress on your back.  Do not drive or use heavy machinery while taking prescription pain medicine.  Use proper lifting techniques. When you bend and lift, use positions that put less stress on your back: ? Bend your knees. ? Keep the load close to your body. ? Avoid twisting.  Exercise regularly as told by your health care provider. Exercising helps your back heal faster and helps prevent back injuries by keeping muscles strong and flexible.  Work with a physical therapist to make a safe exercise program, as recommended by your health care provider. Do any exercises as told by your physical therapist.   Lifestyle  Maintain a healthy weight. Extra weight puts stress on your back and makes it difficult to have   good posture.  Avoid activities or situations that make you feel anxious or stressed. Stress and anxiety increase muscle tension and can make back pain worse. Learn ways to manage anxiety and stress, such as through exercise. General instructions  Sleep on a firm mattress in a comfortable position. Try lying on your side with your knees slightly bent. If you lie on your back, put a pillow under your knees.  Follow your treatment plan as told by your health care provider. This may include: ? Cognitive or behavioral therapy. ? Acupuncture or massage therapy. ? Meditation or yoga. Contact  a health care provider if:  You have pain that is not relieved with rest or medicine.  You have increasing pain going down into your legs or buttocks.  Your pain does not improve after 2 weeks.  You have pain at night.  You lose weight without trying.  You have a fever or chills. Get help right away if:  You develop new bowel or bladder control problems.  You have unusual weakness or numbness in your arms or legs.  You develop nausea or vomiting.  You develop abdominal pain.  You feel faint. Summary  Acute back pain is sudden and usually short-lived.  Use proper lifting techniques. When you bend and lift, use positions that put less stress on your back.  Take over-the-counter and prescription medicines and apply heat or ice as directed by your health care provider. This information is not intended to replace advice given to you by your health care provider. Make sure you discuss any questions you have with your health care provider. Document Revised: 02/12/2020 Document Reviewed: 02/12/2020 Elsevier Patient Education  2021 [employer] Inc.  

## 2020-07-04 NOTE — Progress Notes (Signed)
Subjective:    Patient ID: Alexandria Ingram, female    DOB: 31-Jan-1961, 60 y.o.   MRN: [id number]   Chief Complaint: Medical Management of Chronic Issues    HPI:  1. Graves disease She is not having any problems that she is aware of. Her labs are done at [facility] and there are no results in computer.  2. Gastroesophageal reflux disease without esophagitis Does nexium OTC and that works well for her.  3. Other migraine without status migrainosus, not intractable She is on topamax and has migraines about 1x a month. She is doing really well.  4. Depression, recurrent (HCC) Is doing well and is currently on no meds  5. Fever blister is on valtrex when needed which works well.    Outpatient Encounter Medications as of 07/04/2020  Medication Sig  . acyclovir ointment (ZOVIRAX) 5 % Apply 1 application topically every 3 (three) hours.  . betamethasone dipropionate 0.05 % cream APPLY SMALL AMOUNT TO AFFECTED AREA TWICE A DAY AS NEEDED  . carisoprodol (SOMA) 350 MG tablet TAKE 1 TABLET BY MOUTH 4 TIMES A DAY AS NEEDED FOR MUSCLE SPASMS  . estradiol (ESTRACE) 1 MG tablet Take 1 tablet (1 mg total) by mouth daily.  . fluticasone (FLONASE) 50 MCG/ACT nasal spray Place 2 sprays into both nostrils daily.  Marland Kitchen lidocaine (LIDODERM) 5 % Place 3 patches onto the skin daily. Remove & Discard patch within 12 hours or as directed by MD  . medroxyPROGESTERone (PROVERA) 5 MG tablet TAKE 1 TABLET (5 MG TOTAL) BY MOUTH DAILY.  Marland Kitchen topiramate (TOPAMAX) 50 MG tablet Take 1 tablet (50 mg total) by mouth at bedtime as needed.  . valACYclovir (VALTREX) 1000 MG tablet TAKE 1 TABLET BY MOUTH THREE TIMES A DAY       History reviewed. No pertinent surgical history.  History reviewed. No pertinent family history.  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  Social history: Works for [employer]  Controlled substance contract: n/a    Review  of Systems  Constitutional: Negative for diaphoresis.  Eyes: Negative for pain.  Respiratory: Negative for shortness of breath.   Cardiovascular: Negative for chest pain, palpitations and leg swelling.  Gastrointestinal: Negative for abdominal pain.  Endocrine: Negative for polydipsia.  Skin: Negative for rash.  Neurological: Negative for dizziness, weakness and headaches.  Hematological: Does not bruise/bleed easily.  All other systems reviewed and are negative.      Objective:   Physical Exam Vitals and nursing note reviewed.  Constitutional:      General: She is not in acute distress.    Appearance: Normal appearance. She is well-developed and well-nourished.  HENT:     Head: Normocephalic.     Nose: Nose normal.     Mouth/Throat:     Mouth: Oropharynx is clear and moist.  Eyes:     Extraocular Movements: EOM normal.     Pupils: Pupils are equal, round, and reactive to light.  Neck:     Vascular: No carotid bruit or JVD.  Cardiovascular:     Rate and Rhythm: Normal rate and regular rhythm.     Pulses: Intact distal pulses.     Heart sounds: Normal heart sounds.  Pulmonary:     Effort: Pulmonary effort is normal. No respiratory distress.     Breath sounds: Normal breath sounds. No wheezing or rales.  Chest:     Chest wall: No tenderness.  Abdominal:  General: Bowel sounds are normal. There is no distension or abdominal bruit. Aorta is normal.     Palpations: Abdomen is soft. There is no hepatomegaly, splenomegaly, mass or pulsatile mass.     Tenderness: There is no abdominal tenderness.  Musculoskeletal:        General: No edema. Normal range of motion.     Cervical back: Normal range of motion and neck supple.  Lymphadenopathy:     Cervical: No cervical adenopathy.  Skin:    General: Skin is warm and dry.  Neurological:     Mental Status: She is alert and oriented to person, place, and time.     Deep Tendon Reflexes: Reflexes are normal and symmetric.   Psychiatric:        Mood and Affect: Mood and affect normal.        Behavior: Behavior normal.        Thought Content: Thought content normal.        Judgment: Judgment normal.    BP 132/84   Pulse 89   Temp 97.9 F (36.6 C) (Temporal)   Resp 20   Ht 5\' 6"  (1.676 m)   Wt 148 lb (67.1 kg)   LMP 04/02/2015   BMI 23.89 kg/m          Assessment & Plan:  Alexandria Ingram comes in today with chief complaint of Medical Management of Chronic Issues   Diagnosis and orders addressed:  1. Graves disease Labs were normal at last visit  2. Gastroesophageal reflux disease without esophagitis Avoid spicy foods Do not eat 2 hours prior to bedtime  3. Other migraine without status migrainosus, not intractable Avoid caffeine - topiramate (TOPAMAX) 50 MG tablet; Take 1 tablet (50 mg total) by mouth at bedtime as needed.  Dispense: 30 tablet; Refill: 1  4. Depression, recurrent (HCC) Stress management  5. Fever blister Valtrex as needed  6. Acute midline low back pain without sciatica Moist heat and rest - carisoprodol (SOMA) 350 MG tablet; TAKE 1 TABLET BY MOUTH 4 TIMES A DAY AS NEEDED FOR MUSCLE SPASMS  Dispense: 40 tablet; Refill: 0 - lidocaine (LIDODERM) 5 %; Place 1 patch onto the skin daily. Remove & Discard patch within 12 hours or as directed by MD  Dispense: 30 patch; Refill: 1   Labs reviewed Health Maintenance reviewed Diet and exercise encouraged  Follow up plan: 6 months   Alexandria Alexandria Herring, FNP

## 2020-07-05 ENCOUNTER — Telehealth: Payer: Self-pay | Admitting: *Deleted

## 2020-07-05 DIAGNOSIS — M545 Low back pain, unspecified: Secondary | ICD-10-CM

## 2020-07-05 NOTE — Telephone Encounter (Signed)
Key: [id number] Lidocaine 5% patches Sent to [facility]

## 2020-07-08 NOTE — Telephone Encounter (Signed)
Coverage for this medication is denied for the following reason(s). We reviewed the information we received about your condition and circumstances. We used the plan approved policy when making this decision. The policy states that this medication may be approved when being used for any of the following: -Pain associated with post-herpetic neuralgia -Pain associated with diabetic neuropathy -Pain associated with cancer-related neuropathy (including cancer treatment-related

## 2020-07-11 NOTE — Telephone Encounter (Signed)
Insurance will not pay for lidoderm patches. You can use salpona patches OTC- same thing

## 2020-07-11 NOTE — Telephone Encounter (Signed)
Patient aware and verbalizes understanding. 

## 2020-07-18 ENCOUNTER — Other Ambulatory Visit: Payer: Self-pay | Admitting: Nurse Practitioner

## 2020-07-18 DIAGNOSIS — G43809 Other migraine, not intractable, without status migrainosus: Secondary | ICD-10-CM

## 2020-08-02 ENCOUNTER — Ambulatory Visit: Payer: 59 | Admitting: Family

## 2020-08-02 ENCOUNTER — Other Ambulatory Visit: Payer: Self-pay

## 2020-08-02 ENCOUNTER — Encounter: Payer: Self-pay | Admitting: Family

## 2020-08-02 ENCOUNTER — Other Ambulatory Visit: Payer: Self-pay | Admitting: Family Medicine

## 2020-08-02 VITALS — BP 154/87 | HR 91 | Temp 96.8°F | Ht 66.0 in | Wt 148.4 lb

## 2020-08-02 DIAGNOSIS — B029 Zoster without complications: Secondary | ICD-10-CM | POA: Diagnosis not present

## 2020-08-02 DIAGNOSIS — G43809 Other migraine, not intractable, without status migrainosus: Secondary | ICD-10-CM

## 2020-08-02 DIAGNOSIS — R519 Headache, unspecified: Secondary | ICD-10-CM

## 2020-08-02 MED ORDER — ACYCLOVIR 5 % EX OINT: 1.0000 "application " | TOPICAL_OINTMENT | CUTANEOUS | 1 refills | Status: DC

## 2020-08-02 MED ORDER — METHYLPREDNISOLONE ACETATE 80 MG/ML IJ SUSP
80.0000 mg | Freq: Once | INTRAMUSCULAR | Status: AC
  Administered 2020-08-02: 80 mg via INTRAMUSCULAR

## 2020-08-02 MED ORDER — GABAPENTIN 100 MG PO CAPS: 100.0000 mg | ORAL_CAPSULE | Freq: Three times a day (TID) | ORAL | 3 refills | Status: DC

## 2020-08-02 NOTE — Patient Instructions (Signed)
Shingles  Shingles is an infection. It gives you a painful skin rash and blisters that have fluid in them. Shingles is caused by the same germ (virus) that causes chickenpox. Shingles only happens in people who:  Have had chickenpox.  Have been given a shot of medicine (vaccine) to protect against chickenpox. Shingles is rare in this group. The first symptoms of shingles may be itching, tingling, or pain in an area on your skin. A rash will show on your skin a few days or weeks later. The rash is likely to be on one side of your body. The rash usually has a shape like a belt or a band. Over time, the rash turns into fluid-filled blisters. The blisters will break open, change into scabs, and dry up. Medicines may:  Help with pain and itching.  Help you get better sooner.  Help to prevent long-term problems. Follow these instructions at home: Medicines  Take over-the-counter and prescription medicines only as told by your doctor.  Put on an anti-itch cream or numbing cream where you have a rash, blisters, or scabs. Do this as told by your doctor. Helping with itching and discomfort  Put cold, wet cloths (cold compresses) on the area of the rash or blisters as told by your doctor.  Cool baths can help you feel better. Try adding baking soda or dry oatmeal to the water to lessen itching. Do not bathe in hot water.   Blister and rash care  Keep your rash covered with a loose bandage (dressing).  Wear loose clothing that does not rub on your rash.  Keep your rash and blisters clean. To do this, wash the area with mild soap and cool water as told by your doctor.  Check your rash every day for signs of infection. Check for: ? More redness, swelling, or pain. ? Fluid or blood. ? Warmth. ? Pus or a bad smell.  Do not scratch your rash. Do not pick at your blisters. To help you to not scratch: ? Keep your fingernails clean and cut short. ? Wear gloves or mittens when you sleep, if  scratching is a problem. General instructions  Rest as told by your doctor.  Keep all follow-up visits as told by your doctor. This is important.  Wash your hands often with soap and water. If soap and water are not available, use hand sanitizer. Doing this lowers your chance of getting a skin infection caused by germs (bacteria).  Your infection can cause chickenpox in people who have never had chickenpox or never got a shot of chickenpox vaccine. If you have blisters that did not change into scabs yet, try not to touch other people or be around other people, especially: ? Babies. ? Pregnant women. ? Children who have areas of red, itchy, or rough skin (eczema). ? Very old people who have transplants. ? People who have a long-term (chronic) sickness, like cancer or AIDS. Contact a doctor if:  Your pain does not get better with medicine.  Your pain does not get better after the rash heals.  You have any signs of infection in the rash area. These signs include: ? More redness, swelling, or pain around the rash. ? Fluid or blood coming from the rash. ? The rash area feeling warm to the touch. ? Pus or a bad smell coming from the rash. Get help right away if:  The rash is on your face or nose.  You have pain in your face or pain   by your eye.  You lose feeling on one side of your face.  You have trouble seeing.  You have ear pain, or you have ringing in your ear.  You have a loss of taste.  Your condition gets worse. Summary  Shingles gives you a painful skin rash and blisters that have fluid in them.  Shingles is an infection. It is caused by the same germ (virus) that causes chickenpox.  Keep your rash covered with a loose bandage (dressing). Wear loose clothing that does not rub on your rash.  If you have blisters that did not change into scabs yet, try not to touch other people or be around people. This information is not intended to replace advice given to you by  your health care provider. Make sure you discuss any questions you have with your health care provider. Document Revised: 09/12/2018 Document Reviewed: 01/23/2017 Elsevier Patient Education  2021 Elsevier Inc.  

## 2020-08-02 NOTE — Progress Notes (Signed)
Subjective:    Patient ID: Alexandria Ingram, female    DOB: 03/14/1961, 60 y.o.   MRN: [id number]  Chief Complaint  Patient presents with  . Blister    On bottom     HPI She reports she has a "fever blister on her right buttocks" that she noticed it yesterday. She reports she drives a lot and this is very tender. She reports her pain is a spasm pain of a 10 out 10. She has applied some OTC numbing gel that helps.   She has a hx of fever blisters and has taken Valtrex in the past, but states this give her headaches.    Review of Systems  Skin: Positive for rash.       buttocks  All other systems reviewed and are negative.      Objective:   Physical Exam Vitals reviewed.  Constitutional:      General: She is not in acute distress.    Appearance: She is well-developed and well-nourished.  HENT:     Head: Normocephalic and atraumatic.     Mouth/Throat:     Mouth: Oropharynx is clear and moist.  Eyes:     Pupils: Pupils are equal, round, and reactive to light.  Neck:     Thyroid: No thyromegaly.  Cardiovascular:     Rate and Rhythm: Normal rate and regular rhythm.     Pulses: Intact distal pulses.     Heart sounds: Normal heart sounds. No murmur heard.   Pulmonary:     Effort: Pulmonary effort is normal. No respiratory distress.     Breath sounds: Normal breath sounds. No wheezing.  Abdominal:     General: Bowel sounds are normal. There is no distension.     Palpations: Abdomen is soft.     Tenderness: There is no abdominal tenderness.  Musculoskeletal:        General: No tenderness or edema. Normal range of motion.     Cervical back: Normal range of motion and neck supple.  Skin:    General: Skin is warm and dry.     Findings: Rash present. Rash is vesicular.     Comments: Cluster of vesicular rash on right buttocks, very tender to touch   Neurological:     Mental Status: She is alert and oriented to person, place, and time.     Cranial Nerves: No cranial  nerve deficit.     Deep Tendon Reflexes: Reflexes are normal and symmetric.  Psychiatric:        Mood and Affect: Mood and affect normal.        Behavior: Behavior normal.        Thought Content: Thought content normal.        Judgment: Judgment normal.       BP (!) 154/87   Pulse 91   Temp (!) 96.8 F (36 C) (Temporal)   Ht 5\' 6"  (1.676 m)   Wt 148 lb 6.4 oz (67.3 kg)   LMP 04/02/2015   BMI 23.95 kg/m      Assessment & Plan:  Alexandria Ingram comes in today with chief complaint of Blister (On bottom )   Diagnosis and orders addressed:  1. Herpes zoster without complication Pt does not want to take oral acyclovir or Valtrex because of side effect of HA.  Start gabapentin 100 mg TID Do not scratch Keep clean and dry - acyclovir ointment (ZOVIRAX) 5 %; Apply 1 application topically every 3 (three) hours.  Dispense: 30 g; Refill: 1 - methylPREDNISolone acetate (DEPO-MEDROL) injection 80 mg - gabapentin (NEURONTIN) 100 MG capsule; Take 1 capsule (100 mg total) by mouth 3 (three) times daily.  Dispense: 90 capsule; Refill: 3   Jannifer Rodney, FNP

## 2020-08-15 ENCOUNTER — Telehealth: Payer: Self-pay

## 2020-08-15 NOTE — Telephone Encounter (Signed)
No you do not need referral for that

## 2020-08-15 NOTE — Telephone Encounter (Signed)
Pt is currently using Employee Assistance Program but she missed work today because she is so upset and crying and couldn't go to work like this. She is wanting to see if you could write a letter for her for missing work today?

## 2020-08-15 NOTE — Telephone Encounter (Signed)
Ok to write patient a work note

## 2020-08-24 ENCOUNTER — Telehealth: Payer: Self-pay | Admitting: *Deleted

## 2020-08-24 DIAGNOSIS — B001 Herpesviral vesicular dermatitis: Secondary | ICD-10-CM

## 2020-08-24 MED ORDER — VALACYCLOVIR HCL 1 G PO TABS: 1000.0000 mg | ORAL_TABLET | Freq: Three times a day (TID) | ORAL | 0 refills | Status: DC

## 2020-08-24 NOTE — Addendum Note (Signed)
Addended by: Bennie Pierini on: 08/24/2020 04:39 PM   Modules accepted: Orders

## 2020-08-24 NOTE — Telephone Encounter (Signed)
Valtrex is not on her current med list- who has she been getting it from? Does she take everyday?

## 2020-08-24 NOTE — Telephone Encounter (Signed)
According to med history in chart she was getting from [facility] and taking 3 times a day.

## 2020-08-24 NOTE — Telephone Encounter (Signed)
Fax from [facility] request for Valacyclovir HCL 1 gm tab  1 TID #270 Not on current med list. Last OV 08/02/20

## 2020-09-03 ENCOUNTER — Other Ambulatory Visit: Payer: Self-pay | Admitting: Nurse Practitioner

## 2020-11-25 ENCOUNTER — Other Ambulatory Visit: Payer: Self-pay | Admitting: Nurse Practitioner

## 2020-11-25 ENCOUNTER — Ambulatory Visit (INDEPENDENT_AMBULATORY_CARE_PROVIDER_SITE_OTHER): Payer: 59 | Admitting: Family Medicine

## 2020-11-25 ENCOUNTER — Encounter: Payer: Self-pay | Admitting: Family Medicine

## 2020-11-25 DIAGNOSIS — J0100 Acute maxillary sinusitis, unspecified: Secondary | ICD-10-CM | POA: Diagnosis not present

## 2020-11-25 DIAGNOSIS — B001 Herpesviral vesicular dermatitis: Secondary | ICD-10-CM

## 2020-11-25 MED ORDER — AMOXICILLIN-POT CLAVULANATE 875-125 MG PO TABS: 1.0000 | ORAL_TABLET | Freq: Two times a day (BID) | ORAL | 0 refills | Status: DC

## 2020-11-25 NOTE — Progress Notes (Signed)
    Subjective:    Patient ID: Alexandria Ingram, female    DOB: February 02, 1961, 60 y.o.   MRN: [id number]   HPI: Alexandria Ingram is a 60 y.o. female presenting for earache. Right ear went to throat last night. Doesn't feel like strep. Can't hear like usual In right ear. Lobe feels a little swollen. Congested. Having sinus pressure. Had a flood of mucoid rhino 5&6 days ago.    Depression screen [facility] 2/9 07/04/2020 03/28/2020 10/01/2018 07/01/2018 08/16/2017  Decreased Interest 0 0 0 1 0  Down, Depressed, Hopeless 0 0 0 1 0  PHQ - 2 Score 0 0 0 2 0  Altered sleeping - - 0 2 -  Tired, decreased energy - - 0 2 -  Change in appetite - - 0 2 -  Feeling bad or failure about yourself  - - 0 0 -  Trouble concentrating - - 0 2 -  Moving slowly or fidgety/restless - - 0 0 -  Suicidal thoughts - - 0 0 -  PHQ-9 Score - - 0 10 -     Relevant past medical, surgical, family and social history reviewed and updated as indicated.  Interim medical history since our last visit reviewed. Allergies and medications reviewed and updated.  ROS:  Review of Systems  Constitutional:  Negative for activity change, appetite change, chills and fever.  HENT:  Positive for congestion, ear pain, hearing loss, postnasal drip, rhinorrhea, sinus pressure and sneezing. Negative for ear discharge, nosebleeds and trouble swallowing.   Respiratory:  Positive for cough. Negative for chest tightness and shortness of breath.   Cardiovascular:  Negative for chest pain and palpitations.  Skin:  Negative for rash.    Social History   Tobacco Use  Smoking Status Never  Smokeless Tobacco Never       Objective:     Wt Readings from Last 3 Encounters:  08/02/20 148 lb 6.4 oz (67.3 kg)  07/04/20 148 lb (67.1 kg)  03/28/20 143 lb (64.9 kg)     Exam deferred. Pt. Harboring due to COVID 19. Phone visit performed.   Assessment & Plan:  No diagnosis found.  No orders of the defined types were placed in this encounter.   No  orders of the defined types were placed in this encounter.     There are no diagnoses linked to this encounter.  Virtual Visit via telephone Note  I discussed the limitations, risks, security and privacy concerns of performing an evaluation and management service by telephone and the availability of in person appointments. The patient was identified with two identifiers. Pt.expressed understanding and agreed to proceed. Pt. Is at home. Dr. Darlyn Read is in his office.  Follow Up Instructions:   I discussed the assessment and treatment plan with the patient. The patient was provided an opportunity to ask questions and all were answered. The patient agreed with the plan and demonstrated an understanding of the instructions.   The patient was advised to call back or seek an in-person evaluation if the symptoms worsen or if the condition fails to improve as anticipated.   Total minutes including chart review and phone contact time: 5   Follow up plan: No follow-ups on file.  Mechele Claude, MD [facility]

## 2020-11-28 ENCOUNTER — Encounter: Payer: Self-pay | Admitting: Family Medicine

## 2020-12-12 NOTE — Telephone Encounter (Signed)
Use OTC hydrocortisone and is not improving NTBS- hard to really tell in a picture what is going on.

## 2021-01-16 ENCOUNTER — Ambulatory Visit: Payer: 59 | Admitting: Nurse Practitioner

## 2021-01-19 ENCOUNTER — Encounter: Payer: Self-pay | Admitting: Nurse Practitioner

## 2021-01-19 ENCOUNTER — Other Ambulatory Visit: Payer: Self-pay

## 2021-01-19 ENCOUNTER — Ambulatory Visit: Payer: 59 | Admitting: Nurse Practitioner

## 2021-01-19 VITALS — BP 147/85 | HR 91 | Temp 97.7°F | Resp 20 | Ht 66.0 in | Wt 140.0 lb

## 2021-01-19 DIAGNOSIS — K219 Gastro-esophageal reflux disease without esophagitis: Secondary | ICD-10-CM | POA: Diagnosis not present

## 2021-01-19 DIAGNOSIS — E0500 Thyrotoxicosis with diffuse goiter without thyrotoxic crisis or storm: Secondary | ICD-10-CM

## 2021-01-19 DIAGNOSIS — G43809 Other migraine, not intractable, without status migrainosus: Secondary | ICD-10-CM | POA: Diagnosis not present

## 2021-01-19 DIAGNOSIS — F339 Major depressive disorder, recurrent, unspecified: Secondary | ICD-10-CM

## 2021-01-19 MED ORDER — BETAMETHASONE DIPROPIONATE 0.05 % EX CREA: TOPICAL_CREAM | CUTANEOUS | 2 refills | Status: DC

## 2021-01-19 MED ORDER — TOPIRAMATE 50 MG PO TABS: 50.0000 mg | ORAL_TABLET | Freq: Every evening | ORAL | 1 refills | Status: DC | PRN

## 2021-01-19 NOTE — Progress Notes (Signed)
Subjective:    Patient ID: Alexandria Ingram, female    DOB: [birth year], 60 y.o.   MRN: [id number]   Chief Complaint: Medical Management of Chronic Issues    HPI:  1. Other migraine without status migrainosus, not intractable She is on topamax for prevention. Works pretty well. She does not take it every day because it makes her drowsy. She only takes it mainly on weekends. Still has migraine a couple times a month.  2. Gastroesophageal reflux disease without esophagitis I son nexium daily and is working well for her.  3. Graves disease She is no lon any meds for her thyroid. We have no record of recent labs.   4. Depression, recurrent (HCC) Current;y not on any antidepressants. Says she is doing well. Depression screen [facility] 2/9 01/19/2021 07/04/2020 03/28/2020  Decreased Interest 0 0 0  Down, Depressed, Hopeless 0 0 0  PHQ - 2 Score 0 0 0  Altered sleeping 0 - -  Tired, decreased energy 0 - -  Change in appetite 0 - -  Feeling bad or failure about yourself  1 - -  Trouble concentrating 0 - -  Moving slowly or fidgety/restless 0 - -  Suicidal thoughts 0 - -  PHQ-9 Score 1 - -  Difficult doing work/chores Not difficult at all - -        Outpatient Encounter Medications as of 01/19/2021  Medication Sig   acyclovir ointment (ZOVIRAX) 5 % APPLY 1 APPLICATION TOPICALLY EVERY 3 (THREE) HOURS.   betamethasone dipropionate 0.05 % cream APPLY SMALL AMOUNT TO AFFECTED AREA TWICE A DAY AS NEEDED   carisoprodol (SOMA) 350 MG tablet TAKE 1 TABLET BY MOUTH 4 TIMES A DAY AS NEEDED FOR MUSCLE SPASMS   estradiol (ESTRACE) 1 MG tablet Take 1 tablet (1 mg total) by mouth daily.   fluticasone (FLONASE) 50 MCG/ACT nasal spray Place 2 sprays into both nostrils daily.   gabapentin (NEURONTIN) 100 MG capsule Take 1 capsule (100 mg total) by mouth 3 (three) times daily.   medroxyPROGESTERone (PROVERA) 5 MG tablet TAKE 1 TABLET (5 MG TOTAL) BY MOUTH DAILY.   topiramate (TOPAMAX) 50 MG tablet TAKE  1 TABLET (50 MG TOTAL) BY MOUTH AT BEDTIME AS NEEDED.   valACYclovir (VALTREX) 1000 MG tablet TAKE 1 TABLET BY MOUTH THREE TIMES A DAY   [DISCONTINUED] acyclovir ointment (ZOVIRAX) 5 % Apply 1 application topically every 3 (three) hours.   [DISCONTINUED] amoxicillin-clavulanate (AUGMENTIN) 875-125 MG tablet Take 1 tablet by mouth 2 (two) times daily. Take all of this medication   [DISCONTINUED] lidocaine (LIDODERM) 5 % Place 3 patches onto the skin daily. Remove & Discard patch within 12 hours or as directed by MD   No facility-administered encounter medications on file as of 01/19/2021.    History reviewed. No pertinent surgical history.  History reviewed. No pertinent family history.  New complaints: None today  Social history: Live sby herself  Controlled substance contract: n/a     Review of Systems  Constitutional:  Negative for diaphoresis.  Eyes:  Negative for pain.  Respiratory:  Negative for shortness of breath.   Cardiovascular:  Negative for chest pain, palpitations and leg swelling.  Gastrointestinal:  Negative for abdominal pain.  Endocrine: Negative for polydipsia.  Genitourinary:  Negative for enuresis.  Skin:  Negative for rash.  Neurological:  Negative for dizziness, weakness and headaches.  Hematological:  Does not bruise/bleed easily.  All other systems reviewed and are negative.     Objective:  Physical Exam Vitals and nursing note reviewed.  Constitutional:      Appearance: Normal appearance.  Cardiovascular:     Rate and Rhythm: Normal rate and regular rhythm.     Heart sounds: Normal heart sounds.  Skin:    General: Skin is warm and dry.  Neurological:     General: No focal deficit present.     Mental Status: She is alert and oriented to person, place, and time.  Psychiatric:        Mood and Affect: Mood normal.        Behavior: Behavior normal.   BP (!) 147/85   Pulse 91   Temp 97.7 F (36.5 C) (Temporal)   Resp 20   Ht 5\' 6"  (1.676  m)   Wt 140 lb (63.5 kg)   LMP 04/02/2015   SpO2 95%   BMI 22.60 kg/m         Assessment & Plan:   04/04/2015 in today with chief complaint of Medical Management of Chronic Issues   1. Other migraine without status migrainosus, not intractable Need to take topamax daily in order for it to work better - topiramate (TOPAMAX) 50 MG tablet; Take 1 tablet (50 mg total) by mouth at bedtime as needed.  Dispense: 90 tablet; Refill: 1  2. Gastroesophageal reflux disease without esophagitis Avoid spicy foods Do not eat 2 hours prior to bedtime  3. Graves disease Getting labs drawn at work in a coupleof weeks  4. Depression, recurrent [facility]) Stress management    The above assessment and management plan was discussed with the patient. The patient verbalized understanding of and has agreed to the management plan. Patient is aware to call the clinic if symptoms persist or worsen. Patient is aware when to return to the clinic for a follow-up visit. Patient educated on when it is appropriate to go to the emergency department.   Mary-Margaret [facility], FNP

## 2021-01-20 NOTE — Telephone Encounter (Signed)
Have you taken any ibuprofen or aleve?

## 2021-01-25 NOTE — Telephone Encounter (Signed)
You probably need to see ortho- who or where would like to go?

## 2021-01-26 NOTE — Telephone Encounter (Signed)
Please do referral for [facility] for bil shoulder pain

## 2021-01-27 ENCOUNTER — Other Ambulatory Visit: Payer: Self-pay

## 2021-01-27 DIAGNOSIS — M25511 Pain in right shoulder: Secondary | ICD-10-CM

## 2021-02-07 ENCOUNTER — Other Ambulatory Visit: Payer: Self-pay

## 2021-02-07 ENCOUNTER — Other Ambulatory Visit: Payer: Self-pay | Admitting: Nurse Practitioner

## 2021-02-07 DIAGNOSIS — M545 Low back pain, unspecified: Secondary | ICD-10-CM

## 2021-02-08 MED ORDER — CARISOPRODOL 350 MG PO TABS: ORAL_TABLET | ORAL | 0 refills | Status: DC

## 2021-02-26 ENCOUNTER — Other Ambulatory Visit: Payer: Self-pay | Admitting: Nurse Practitioner

## 2021-02-27 ENCOUNTER — Other Ambulatory Visit: Payer: Self-pay | Admitting: Family

## 2021-02-27 DIAGNOSIS — B029 Zoster without complications: Secondary | ICD-10-CM

## 2021-02-27 NOTE — Telephone Encounter (Signed)
Last PE 03/28/20

## 2021-03-03 ENCOUNTER — Ambulatory Visit: Payer: 59 | Admitting: Nurse Practitioner

## 2021-03-03 ENCOUNTER — Encounter: Payer: Self-pay | Admitting: Nurse Practitioner

## 2021-03-03 ENCOUNTER — Other Ambulatory Visit: Payer: Self-pay | Admitting: Nurse Practitioner

## 2021-03-03 ENCOUNTER — Telehealth: Payer: Self-pay | Admitting: Nurse Practitioner

## 2021-03-03 DIAGNOSIS — U071 COVID-19: Secondary | ICD-10-CM

## 2021-03-03 MED ORDER — PREDNISONE 20 MG PO TABS: 40.0000 mg | ORAL_TABLET | Freq: Every day | ORAL | 0 refills | Status: AC

## 2021-03-03 MED ORDER — MOLNUPIRAVIR EUA 200MG CAPSULE: 4.0000 | ORAL_CAPSULE | Freq: Two times a day (BID) | ORAL | 0 refills | Status: AC

## 2021-03-03 NOTE — Telephone Encounter (Signed)
See patient message, patient had called and sent message.

## 2021-03-03 NOTE — Progress Notes (Signed)
Virtual Visit  Note Due to COVID-19 pandemic this visit was conducted virtually. This visit type was conducted due to national recommendations for restrictions regarding the COVID-19 Pandemic (e.g. social distancing, sheltering in place) in an effort to limit this patient's exposure and mitigate transmission in our community. All issues noted in this document were discussed and addressed.  A physical exam was not performed with this format.  I connected with Alexandria Ingram on 03/03/21 at 11:32 by telephone and verified that I am speaking with the correct person using two identifiers. Alexandria Ingram is currently located at home and 2no one is currently with her during visit. The provider, Mary-Margaret Daphine Deutscher, FNP is located in their office at time of visit.  I discussed the limitations, risks, security and privacy concerns of performing an evaluation and management service by telephone and the availability of in person appointments. I also discussed with the patient that there may be a patient responsible charge related to this service. The patient expressed understanding and agreed to proceed.   History and Present Illness:  Patient tested positive for covid on Saturday the Sept 24. She says she is still not feeling well. She is having congestion, productive cough, dizziness. She did not get the antiviral.    Review of Systems  Constitutional:  Positive for malaise/fatigue. Negative for chills and fever.  HENT:  Positive for congestion. Negative for sore throat.   Respiratory:  Positive for cough and sputum production. Negative for shortness of breath.   Musculoskeletal:  Positive for myalgias.  Neurological:  Positive for headaches.    Observations/Objective: Alert and oriented- answers all questions appropriately No distress Deep cough noted  Assessment and Plan: Alexandria Ingram in today with chief complaint of No chief complaint on file.   1. Lab test positive for detection of  COVID-19 virus Oyt of treatment window for antiviral 1. Take meds as prescribed 2. Use a cool mist humidifier especially during the winter months and when heat has been humid. 3. Use saline nose sprays frequently 4. Saline irrigations of the nose can be very helpful if done frequently.  * 4X daily for 1 week*  * Use of a nettie pot can be helpful with this. Follow directions with this* 5. Drink plenty of fluids 6. Keep thermostat turn down low 7.For any cough or congestion  Use plain Mucinex- regular strength or max strength is fine   * Children- consult with Pharmacist for dosing 8. For fever or aces or pains- take tylenol or ibuprofen appropriate for age and weight.  * for fevers greater than 101 orally you may alternate ibuprofen and tylenol every  3 hours.    - predniSONE (DELTASONE) 20 MG tablet; Take 2 tablets (40 mg total) by mouth daily with breakfast for 5 days. 2 po daily for 5 days  Dispense: 10 tablet; Refill: 0     Follow Up Instructions: prn    I discussed the assessment and treatment plan with the patient. The patient was provided an opportunity to ask questions and all were answered. The patient agreed with the plan and demonstrated an understanding of the instructions.   The patient was advised to call back or seek an in-person evaluation if the symptoms worsen or if the condition fails to improve as anticipated.  The above assessment and management plan was discussed with the patient. The patient verbalized understanding of and has agreed to the management plan. Patient is aware to call the clinic if symptoms persist  or worsen. Patient is aware when to return to the clinic for a follow-up visit. Patient educated on when it is appropriate to go to the emergency department.   Time call ended:  11:45  I provided 13 minutes of  non face-to-face time during this encounter.    Mary-Margaret Daphine Deutscher, FNP

## 2021-03-03 NOTE — Telephone Encounter (Signed)
She is out of treatment window- has to start within 5 days of symptoms.

## 2021-03-03 NOTE — Progress Notes (Signed)
Patient was infromed that she os out of the treatment window and these meds will really make no difference.

## 2021-03-03 NOTE — Telephone Encounter (Signed)
Out of treatment window for antviral. Has to start within 5 days of onset

## 2021-03-03 NOTE — Telephone Encounter (Signed)
I will call in meds but it is not going to make a difference. According to medication guidelines you are suppose to start meds within 5 daysa of symptom onset. Molnupivir sent to pharmacy- take as directed

## 2021-03-03 NOTE — Telephone Encounter (Signed)
Please review

## 2021-03-03 NOTE — Telephone Encounter (Signed)
No answer, no voicemail.

## 2021-03-22 ENCOUNTER — Ambulatory Visit: Payer: 59 | Admitting: Family Medicine

## 2021-03-22 ENCOUNTER — Encounter: Payer: Self-pay | Admitting: Family Medicine

## 2021-03-22 DIAGNOSIS — J301 Allergic rhinitis due to pollen: Secondary | ICD-10-CM | POA: Diagnosis not present

## 2021-03-22 MED ORDER — PREDNISONE 20 MG PO TABS: 40.0000 mg | ORAL_TABLET | Freq: Every day | ORAL | 0 refills | Status: AC

## 2021-03-22 MED ORDER — FLUTICASONE PROPIONATE 50 MCG/ACT NA SUSP: 2.0000 | Freq: Every day | NASAL | 6 refills | Status: DC

## 2021-03-22 NOTE — Progress Notes (Signed)
   Virtual Visit  Note Due to COVID-19 pandemic this visit was conducted virtually. This visit type was conducted due to national recommendations for restrictions regarding the COVID-19 Pandemic (e.g. social distancing, sheltering in place) in an effort to limit this patient's exposure and mitigate transmission in our community. All issues noted in this document were discussed and addressed.  A physical exam was not performed with this format.  I connected with Alexandria Ingram on 03/22/21 at 1129 by telephone and verified that I am speaking with the correct person using two identifiers. Alexandria Ingram is currently located at work and no one is currently with her during the visit. The provider, Gabriel Earing, FNP is located in their office at time of visit.  I discussed the limitations, risks, security and privacy concerns of performing an evaluation and management service by telephone and the availability of in person appointments. I also discussed with the patient that there may be a patient responsible charge related to this service. The patient expressed understanding and agreed to proceed.  CC: runny nose  History and Present Illness:  HPI Alexandria Ingram reports sneezing, runny nose, and head congestion with facial pressure around her cheeks x 1 day. She denies cough, fever, sore throat, ear pain, body aches, or chills. She has been taking benadryl and zyrtec with little improvement. She has a recent Covid infection, about 1 month ago.     ROS As per HPI.   Observations/Objective: Alert and oriented x 3. Able to speak in full sentences without difficulty.    Assessment and Plan: Samani was seen today for nasal congestion.  Diagnoses and all orders for this visit:  Seasonal allergic rhinitis due to pollen Continue zyrtec. Discussed OTC decongestant. Flonase and prednisone burst as below.  -     fluticasone (FLONASE) 50 MCG/ACT nasal spray; Place 2 sprays into both nostrils daily. -      predniSONE (DELTASONE) 20 MG tablet; Take 2 tablets (40 mg total) by mouth daily with breakfast for 3 days.    Follow Up Instructions: Return to office for new or worsening symptoms, or if symptoms persist.     I discussed the assessment and treatment plan with the patient. The patient was provided an opportunity to ask questions and all were answered. The patient agreed with the plan and demonstrated an understanding of the instructions.   The patient was advised to call back or seek an in-person evaluation if the symptoms worsen or if the condition fails to improve as anticipated.  The above assessment and management plan was discussed with the patient. The patient verbalized understanding of and has agreed to the management plan. Patient is aware to call the clinic if symptoms persist or worsen. Patient is aware when to return to the clinic for a follow-up visit. Patient educated on when it is appropriate to go to the emergency department.   Time call ended:  1140  I provided 11 minutes of  non face-to-face time during this encounter.    Gabriel Earing, FNP

## 2021-04-29 ENCOUNTER — Other Ambulatory Visit: Payer: Self-pay | Admitting: Nurse Practitioner

## 2021-05-15 ENCOUNTER — Other Ambulatory Visit: Payer: Self-pay | Admitting: Nurse Practitioner

## 2021-05-15 DIAGNOSIS — B029 Zoster without complications: Secondary | ICD-10-CM

## 2021-06-17 ENCOUNTER — Other Ambulatory Visit: Payer: Self-pay | Admitting: Nurse Practitioner

## 2021-06-19 ENCOUNTER — Encounter: Payer: Self-pay | Admitting: Nurse Practitioner

## 2021-06-19 NOTE — Telephone Encounter (Signed)
No answer letter sent

## 2021-06-19 NOTE — Telephone Encounter (Signed)
Last physical 03/28/20. Patient must be seen for any further refills.

## 2021-06-20 ENCOUNTER — Telehealth: Payer: Self-pay | Admitting: Nurse Practitioner

## 2021-06-21 ENCOUNTER — Ambulatory Visit (INDEPENDENT_AMBULATORY_CARE_PROVIDER_SITE_OTHER): Payer: Managed Care, Other (non HMO)

## 2021-06-21 ENCOUNTER — Other Ambulatory Visit: Payer: Self-pay

## 2021-06-21 ENCOUNTER — Ambulatory Visit: Payer: 59

## 2021-06-21 ENCOUNTER — Other Ambulatory Visit: Payer: Self-pay | Admitting: Nurse Practitioner

## 2021-06-21 DIAGNOSIS — Z1231 Encounter for screening mammogram for malignant neoplasm of breast: Secondary | ICD-10-CM

## 2021-07-17 ENCOUNTER — Other Ambulatory Visit: Payer: Self-pay | Admitting: Nurse Practitioner

## 2021-07-18 ENCOUNTER — Ambulatory Visit (INDEPENDENT_AMBULATORY_CARE_PROVIDER_SITE_OTHER): Payer: 59 | Admitting: Nurse Practitioner

## 2021-07-18 ENCOUNTER — Encounter: Payer: Self-pay | Admitting: Nurse Practitioner

## 2021-07-18 VITALS — BP 131/87 | HR 87 | Temp 97.8°F | Resp 20 | Ht 66.0 in | Wt 147.0 lb

## 2021-07-18 DIAGNOSIS — M545 Low back pain, unspecified: Secondary | ICD-10-CM | POA: Diagnosis not present

## 2021-07-18 DIAGNOSIS — G43809 Other migraine, not intractable, without status migrainosus: Secondary | ICD-10-CM | POA: Diagnosis not present

## 2021-07-18 DIAGNOSIS — B001 Herpesviral vesicular dermatitis: Secondary | ICD-10-CM | POA: Diagnosis not present

## 2021-07-18 DIAGNOSIS — F411 Generalized anxiety disorder: Secondary | ICD-10-CM | POA: Diagnosis not present

## 2021-07-18 MED ORDER — ESTRADIOL 1 MG PO TABS: 1.0000 mg | ORAL_TABLET | Freq: Every day | ORAL | 3 refills | Status: DC

## 2021-07-18 MED ORDER — ESCITALOPRAM OXALATE 10 MG PO TABS: 10.0000 mg | ORAL_TABLET | Freq: Every day | ORAL | 3 refills | Status: DC

## 2021-07-18 MED ORDER — VALACYCLOVIR HCL 1 G PO TABS: 1000.0000 mg | ORAL_TABLET | Freq: Three times a day (TID) | ORAL | 0 refills | Status: DC

## 2021-07-18 MED ORDER — MEDROXYPROGESTERONE ACETATE 5 MG PO TABS: 5.0000 mg | ORAL_TABLET | Freq: Every day | ORAL | 0 refills | Status: DC

## 2021-07-18 MED ORDER — CARISOPRODOL 350 MG PO TABS: ORAL_TABLET | ORAL | 0 refills | Status: DC

## 2021-07-18 MED ORDER — TOPIRAMATE 50 MG PO TABS: 50.0000 mg | ORAL_TABLET | Freq: Every evening | ORAL | 1 refills | Status: DC | PRN

## 2021-07-18 NOTE — Patient Instructions (Signed)

## 2021-07-18 NOTE — Progress Notes (Signed)
Subjective:    Patient ID: Alexandria Ingram, female    DOB: Jun 03, 1961, 61 y.o.   MRN: [id number]   Chief Complaint: Anxiety   Anxiety Patient reports no chest pain, dizziness, palpitations or shortness of breath.    Patient comes in c.o anxiety. Her husband died of covid 2 years ago. Her son died last year. Now she is taking care of her elderly mother and she is very stressed out. She  is currently on no meds for this. She was put on xanax many year sago but she did not like how it made her feel.  GAD 7 : Generalized Anxiety Score 07/18/2021 01/19/2021  Nervous, Anxious, on Edge 1 0  Control/stop worrying 0 0  Worry too much - different things 1 1  Trouble relaxing 1 0  Restless 0 0  Easily annoyed or irritable 1 0  Afraid - awful might happen 0 0  Total GAD 7 Score 4 1  Anxiety Difficulty Somewhat difficult Not difficult at all    Depression screen [facility] 2/9 07/18/2021 01/19/2021 07/04/2020  Decreased Interest 1 0 0  Down, Depressed, Hopeless 1 0 0  PHQ - 2 Score 2 0 0  Altered sleeping 1 0 -  Tired, decreased energy 1 0 -  Change in appetite 1 0 -  Feeling bad or failure about yourself  0 1 -  Trouble concentrating 0 0 -  Moving slowly or fidgety/restless 0 0 -  Suicidal thoughts 0 0 -  PHQ-9 Score 5 1 -  Difficult doing work/chores Somewhat difficult Not difficult at all -       Review of Systems  Constitutional:  Negative for diaphoresis.  Eyes:  Negative for pain.  Respiratory:  Negative for shortness of breath.   Cardiovascular:  Negative for chest pain, palpitations and leg swelling.  Gastrointestinal:  Negative for abdominal pain.  Endocrine: Negative for polydipsia.  Skin:  Negative for rash.  Neurological:  Negative for dizziness, weakness and headaches.  Hematological:  Does not bruise/bleed easily.  All other systems reviewed and are negative.     Objective:   Physical Exam Vitals and nursing note reviewed.  Constitutional:      General: She is not  in acute distress.    Appearance: Normal appearance. She is well-developed.  HENT:     Head: Normocephalic.     Right Ear: Tympanic membrane normal.     Left Ear: Tympanic membrane normal.     Nose: Nose normal.     Mouth/Throat:     Mouth: Mucous membranes are moist.  Eyes:     Pupils: Pupils are equal, round, and reactive to light.  Neck:     Vascular: No carotid bruit or JVD.  Cardiovascular:     Rate and Rhythm: Normal rate and regular rhythm.     Heart sounds: Normal heart sounds.  Pulmonary:     Effort: Pulmonary effort is normal. No respiratory distress.     Breath sounds: Normal breath sounds. No wheezing or rales.  Chest:     Chest wall: No tenderness.  Abdominal:     General: Bowel sounds are normal. There is no distension or abdominal bruit.     Palpations: Abdomen is soft. There is no hepatomegaly, splenomegaly, mass or pulsatile mass.     Tenderness: There is no abdominal tenderness.  Musculoskeletal:        General: Normal range of motion.     Cervical back: Normal range of motion and neck supple.  Lymphadenopathy:     Cervical: No cervical adenopathy.  Skin:    General: Skin is warm and dry.  Neurological:     Mental Status: She is alert and oriented to person, place, and time.     Deep Tendon Reflexes: Reflexes are normal and symmetric.  Psychiatric:        Behavior: Behavior normal.        Thought Content: Thought content normal.        Judgment: Judgment normal.    BP 131/87    Pulse 87    Temp 97.8 F (36.6 C) (Temporal)    Resp 20    Ht 5\' 6"  (1.676 m)    Wt 147 lb (66.7 kg)    LMP 04/02/2015    SpO2 97%    BMI 23.73 kg/m        Assessment & Plan:  Alexandria Ingram in today with chief complaint of Anxiety   1. Other migraine without status migrainosus, not intractable Meds filled - topiramate (TOPAMAX) 50 MG tablet; Take 1 tablet (50 mg total) by mouth at bedtime as needed.  Dispense: 90 tablet; Refill: 1  2. Fever blister Meds filles -  valACYclovir (VALTREX) 1000 MG tablet; Take 1 tablet (1,000 mg total) by mouth 3 (three) times daily.  Dispense: 270 tablet; Refill: 0  3. Acute midline low back pain without sciatica Meds filled - carisoprodol (SOMA) 350 MG tablet; TAKE 1 TABLET BY MOUTH 4 TIMES A DAY AS NEEDED FOR MUSCLE SPASMS  Dispense: 40 tablet; Refill: 0  4. GAD Start on lexapro- side effects reviewed Stress maanegment RTO prn  The above assessment and management plan was discussed with the patient. The patient verbalized understanding of and has agreed to the management plan. Patient is aware to call the clinic if symptoms persist or worsen. Patient is aware when to return to the clinic for a follow-up visit. Patient educated on when it is appropriate to go to the emergency department.   Mary-Margaret Hassell Done, FNP

## 2021-07-21 ENCOUNTER — Ambulatory Visit: Payer: 59 | Admitting: Nurse Practitioner

## 2021-08-01 ENCOUNTER — Other Ambulatory Visit: Payer: Self-pay | Admitting: Nurse Practitioner

## 2021-08-11 ENCOUNTER — Ambulatory Visit: Payer: 59 | Admitting: Nurse Practitioner

## 2021-08-11 ENCOUNTER — Encounter: Payer: Self-pay | Admitting: Nurse Practitioner

## 2021-08-11 DIAGNOSIS — F419 Anxiety disorder, unspecified: Secondary | ICD-10-CM

## 2021-08-11 DIAGNOSIS — Z634 Disappearance and death of family member: Secondary | ICD-10-CM | POA: Diagnosis not present

## 2021-08-11 DIAGNOSIS — F4321 Adjustment disorder with depressed mood: Secondary | ICD-10-CM

## 2021-08-11 MED ORDER — BUSPIRONE HCL 10 MG PO TABS: 10.0000 mg | ORAL_TABLET | Freq: Two times a day (BID) | ORAL | 1 refills | Status: DC

## 2021-08-11 NOTE — Patient Instructions (Signed)
Prolonged Grief ?Grief is a normal response to the death of someone close to you. Feelings of fear, anger, and guilt can affect almost everyone who loses a loved one. It is also common to have symptoms of depression while you are grieving. These include problems with sleep, loss of appetite, and lack of energy. They may last for weeks or months after a loss. ?Prolonged grief is different from normal grief or depression. Normal grieving involves sadness and feelings of loss, but those feelings get better and heal over time. Prolonged grief is a severe type of grief that lasts for a long time, usually for several months to a year or longer. It interferes with your ability to function normally. Prolonged grief may require treatment from a mental health care provider. ?What are the causes? ?The cause of this condition is not known. It is not clear why some people continue to struggle with grief and others do not. ?What increases the risk? ?You are more likely to develop this condition if: ?The death of your loved one was sudden or unexpected. ?The death of your loved one was due to a violent event. ?Your loved one died from suicide. ?Your loved one was a child or a young person. ?You were very close to your loved one, or you were dependent on him or her. ?You have a history of depression or anxiety. ?You have very little to no support from others. ?What are the signs or symptoms? ?Symptoms of this condition include: ?Feeling disbelief or having a lack of emotion (numbness). ?Being unable to enjoy good memories of your loved one. ?Needing to avoid anything or anyone that reminds you of your loved one. ?Being unable to stop thinking about the death. ?Feeling intense anger, loneliness, helplessness, or guilt. ?Feeling that your life is meaningless and empty, and having trouble moving on with your life. ?How is this diagnosed? ?This condition may be diagnosed based on: ?Your symptoms. Prolonged grief will be diagnosed if  you have ongoing symptoms of grief for 6 months for children and 12 months or longer for adults. ?The effect of symptoms on your life. You may be diagnosed with this condition if your symptoms are interfering with your ability to live your life. ?Your health care provider may recommend that you see a mental health care provider. Many symptoms of depression are similar to the symptoms of prolonged grief. It is important to be evaluated for prolonged grief along with other mental health conditions. ?How is this treated? ?This condition is most commonly treated with talk therapy. This therapy is offered by a mental health specialist (psychiatrist). During therapy: ?You will learn healthy ways to cope with the loss of your loved one. ?Your mental health care provider may recommend antidepressant medicines. ?Follow these instructions at home: ?Lifestyle ? ?Take care of yourself. ?Eat on a regular basis, and maintain a healthy diet. Eat plenty of fruits, vegetables, lean protein, and whole grains. ?Try to get some exercise each day. Aim for 30 minutes of exercise on most days of the week. ?Keep a consistent sleep schedule. Try to get 8 or more hours of sleep each night. ?Start doing the things that you used to enjoy. ?Do not use drugs or alcohol to ease your symptoms. ?Spend time with friends and loved ones. ?General instructions ?Take over-the-counter and prescription medicines only as told by your health care provider. ?Consider joining a grief (bereavement) support group to help you deal with your loss. ?Keep all follow-up visits. This is important. ?  Contact a health care provider if: ?Your symptoms prevent you from functioning normally. ?Your symptoms do not get better with treatment. ?Get help right away if: ?You have serious thoughts about hurting yourself or someone else. ?You have suicidal feelings. ?Get help right away if you feel like you may hurt yourself or others, or have thoughts about taking your own life.  Go to your nearest emergency room or: ?Call 911. ?Call the [facility] at [phone] or [phone]. This is open 24 hours a day. ?Text the Crisis Text Line at 802-855-3094. ?Summary ?Prolonged grief is a severe type of grief that lasts for a long time. This grief is not likely to go away on its own. Get the help you need. ?Some griefs are more difficult than others and can cause this condition. You may need a certain type of treatment to help you recover if the loss of your loved one was sudden, violent, or due to suicide. ?You may feel guilty about moving on with your life. Getting help does not mean that you are forgetting your loved one. It means that you are taking care of yourself. ?Prolonged grief is best treated with talk therapy. Medicines may also be prescribed. ?Seek the help you need, and find support that will help you recover. ?This information is not intended to replace advice given to you by your health care provider. Make sure you discuss any questions you have with your health care provider. ?Document Revised: 01/09/2021 Document Reviewed: 01/09/2021 ?Elsevier Patient Education ? [place]. ? ?

## 2021-08-11 NOTE — Progress Notes (Signed)
? ?Virtual Visit  Note ?Due to COVID-19 pandemic this visit was conducted virtually. This visit type was conducted due to national recommendations for restrictions regarding the COVID-19 Pandemic (e.g. social distancing, sheltering in place) in an effort to limit this patient's exposure and mitigate transmission in our community. All issues noted in this document were discussed and addressed.  A physical exam was not performed with this format. ? ?I connected with Alexandria Ingram on 08/11/21 at 1:11 by telephone and verified that I am speaking with the correct person using two identifiers. Alexandria Ingram is currently located at home and no o ne is currently with her during visit. The provider, Mary-Margaret Hassell Done, FNP is located in their office at time of visit. ? ?I discussed the limitations, risks, security and privacy concerns of performing an evaluation and management service by telephone and the availability of in person appointments. I also discussed with the patient that there may be a patient responsible charge related to this service. The patient expressed understanding and agreed to proceed. ? ? ?History and Present Illness: ?HPI ?Patient was seen in office on 07/18/21. She was experiencing anxiety and was started on lexapro 10mg  daily. She has been out of work all week because of her stress. She is experiencing such grief form the death of her son. She has had diarrhea and vomiting due the  stress. She has not been able to work. The lexapro makes her feel weird and has not helped with anxiety, so she quit taking. She wants  to go back to work because she thinks it will help her. ? ? ?GAD 7 : Generalized Anxiety Score 08/11/2021 07/18/2021 01/19/2021  ?Nervous, Anxious, on Edge 3 1 0  ?Control/stop worrying 3 0 0  ?Worry too much - different things 3 1 1   ?Trouble relaxing 2 1 0  ?Restless 2 0 0  ?Easily annoyed or irritable 2 1 0  ?Afraid - awful might happen 1 0 0  ?Total GAD 7 Score 16 4 1   ?Anxiety  Difficulty Somewhat difficult Somewhat difficult Not difficult at all  ? ? ? ? ?Review of Systems  ?Psychiatric/Behavioral:  The patient is nervous/anxious. The patient does not have insomnia.   ? ? ?Observations/Objective: ?Alert and oriented- answers all questions appropriately ?No distress ? ? ?Assessment and Plan: ?Alexandria Ingram in today with chief complaint of No chief complaint on file. ? ? ?1. Anxiety ?2. Grief at loss of child ?Stress management ?Seek grief counseling ?Added buspar as prn basis ?Back to work on [place] ? ? ? ? ?Follow Up Instructions: ?3-4 weeks and prn ? ?  ?I discussed the assessment and treatment plan with the patient. The patient was provided an opportunity to ask questions and all were answered. The patient agreed with the plan and demonstrated an understanding of the instructions. ?  ?The patient was advised to call back or seek an in-person evaluation if the symptoms worsen or if the condition fails to improve as anticipated. ? ?The above assessment and management plan was discussed with the patient. The patient verbalized understanding of and has agreed to the management plan. Patient is aware to call the clinic if symptoms persist or worsen. Patient is aware when to return to the clinic for a follow-up visit. Patient educated on when it is appropriate to go to the emergency department.  ? ?Time call ended:  1:26 ? ?I provided 15 minutes of  non face-to-face time during this encounter. ? ? ? ?Mary-Margaret  Hassell Done, [place] ? ? ?

## 2021-08-14 ENCOUNTER — Other Ambulatory Visit: Payer: Self-pay | Admitting: Nurse Practitioner

## 2021-08-16 NOTE — Telephone Encounter (Signed)
Buspirone is a twice daily medication so she can take it morning and evening time.  She does not have to do anything specific with the Topamax but what she has been doing.  Should NOT have any decrease absorption taking the buspirone ?

## 2021-08-16 NOTE — Telephone Encounter (Signed)
Again, yes but I think pt wants to hear this from her PCP. ?

## 2021-08-17 NOTE — Telephone Encounter (Signed)
Please let patient know that I agree with Dr. Nadine Counts, it is ok to take meds together. ?

## 2021-09-03 ENCOUNTER — Other Ambulatory Visit: Payer: Self-pay | Admitting: Nurse Practitioner

## 2021-09-09 ENCOUNTER — Other Ambulatory Visit: Payer: Self-pay | Admitting: Nurse Practitioner

## 2021-09-11 NOTE — Telephone Encounter (Signed)
Last OV 08/11/2021. Last RF 01/19/2021 60g 2RF. Next OV none scheduled ? ?

## 2021-09-20 ENCOUNTER — Encounter: Payer: Self-pay | Admitting: Nurse Practitioner

## 2021-09-20 ENCOUNTER — Ambulatory Visit: Payer: 59 | Admitting: Nurse Practitioner

## 2021-09-20 ENCOUNTER — Telehealth: Payer: 59 | Admitting: Nurse Practitioner

## 2021-09-20 DIAGNOSIS — Z634 Disappearance and death of family member: Secondary | ICD-10-CM | POA: Diagnosis not present

## 2021-09-20 DIAGNOSIS — F4321 Adjustment disorder with depressed mood: Secondary | ICD-10-CM

## 2021-09-20 MED ORDER — CITALOPRAM HYDROBROMIDE 10 MG PO TABS: 10.0000 mg | ORAL_TABLET | Freq: Every day | ORAL | 3 refills | Status: DC

## 2021-09-20 NOTE — Progress Notes (Signed)
? ?  Virtual Visit  Note ?Due to COVID-19 pandemic this visit was conducted virtually. This visit type was conducted due to national recommendations for restrictions regarding the COVID-19 Pandemic (e.g. social distancing, sheltering in place) in an effort to limit this patient's exposure and mitigate transmission in our community. All issues noted in this document were discussed and addressed.  A physical exam was not performed with this format. ? ?I connected with Alexandria Ingram on 09/20/21 at 1:45 by telephone and verified that I am speaking with the correct person using two identifiers. Alexandria Ingram is currently located at home and no one is currently with her during visit. The provider, Mary-Margaret Daphine Deutscher, FNP is located in their office at time of visit. ? ?I discussed the limitations, risks, security and privacy concerns of performing an evaluation and management service by telephone and the availability of in person appointments. I also discussed with the patient that there may be a patient responsible charge related to this service. The patient expressed understanding and agreed to proceed. ? ? ?History and Present Illness: ? ?Patient does appointment c/o severe grief today. She has been having issues with  this off and on for several months. This last episode started 3 day sago. Says she does not want [place] where and do anything. She has been seeing counseling through work and actually has an appointment this evening, sh eis on buspar which helps some. We tried lexapro and tat caused nausea so she stopped taking it.  ? ? ? ?Review of Systems  ?Neurological:  Positive for speech change.  ?Psychiatric/Behavioral:  Positive for depression and suicidal ideas. The patient is nervous/anxious.   ? ? ?Observations/Objective: ?Alert and oriented- answers all questions appropriately ?Tearful during appointment ? ? ?Assessment and Plan: ?Alexandria Ingram in today with chief complaint of No chief complaint on  file. ? ? ?1. Grief at loss of child ?Continue counseling ? Check into some grief groups ?Start on celexa- side effects discussed ?Continue buspar as needed BID ? ? ?Follow Up Instructions: ?prn ? ?  ?I discussed the assessment and treatment plan with the patient. The patient was provided an opportunity to ask questions and all were answered. The patient agreed with the plan and demonstrated an understanding of the instructions. ?  ?The patient was advised to call back or seek an in-person evaluation if the symptoms worsen or if the condition fails to improve as anticipated. ? ?The above assessment and management plan was discussed with the patient. The patient verbalized understanding of and has agreed to the management plan. Patient is aware to call the clinic if symptoms persist or worsen. Patient is aware when to return to the clinic for a follow-up visit. Patient educated on when it is appropriate to go to the emergency department.  ? ?Time call ended:   ?1:57 ?I provided 12 minutes of  non face-to-face time during this encounter. ? ? ? ?Mary-Margaret Daphine Deutscher, FNP ? ? ?

## 2021-09-20 NOTE — Patient Instructions (Signed)
Prolonged Grief Grief is a normal response to the death of someone close to you. Feelings of fear, anger, and guilt can affect almost everyone who loses a loved one. It is also common to have symptoms of depression while you are grieving. These include problems with sleep, loss of appetite, and lack of energy. They may last for weeks or months after a loss. Prolonged grief is different from normal grief or depression. Normal grieving involves sadness and feelings of loss, but those feelings get better and heal over time. Prolonged grief is a severe type of grief that lasts for a long time, usually for several months to a year or longer. It interferes with your ability to function normally. Prolonged grief may require treatment from a mental health care provider. What are the causes? The cause of this condition is not known. It is not clear why some people continue to struggle with grief and others do not. What increases the risk? You are more likely to develop this condition if: The death of your loved one was sudden or unexpected. The death of your loved one was due to a violent event. Your loved one died from suicide. Your loved one was a child or a young person. You were very close to your loved one, or you were dependent on him or her. You have a history of depression or anxiety. You have very little to no support from others. What are the signs or symptoms? Symptoms of this condition include: Feeling disbelief or having a lack of emotion (numbness). Being unable to enjoy good memories of your loved one. Needing to avoid anything or anyone that reminds you of your loved one. Being unable to stop thinking about the death. Feeling intense anger, loneliness, helplessness, or guilt. Feeling that your life is meaningless and empty, and having trouble moving on with your life. How is this diagnosed? This condition may be diagnosed based on: Your symptoms. Prolonged grief will be diagnosed if  you have ongoing symptoms of grief for 6 months for children and 12 months or longer for adults. The effect of symptoms on your life. You may be diagnosed with this condition if your symptoms are interfering with your ability to live your life. Your health care provider may recommend that you see a mental health care provider. Many symptoms of depression are similar to the symptoms of prolonged grief. It is important to be evaluated for prolonged grief along with other mental health conditions. How is this treated? This condition is most commonly treated with talk therapy. This therapy is offered by a mental health specialist (psychiatrist). During therapy: You will learn healthy ways to cope with the loss of your loved one. Your mental health care provider may recommend antidepressant medicines. Follow these instructions at home: Lifestyle  Take care of yourself. Eat on a regular basis, and maintain a healthy diet. Eat plenty of fruits, vegetables, lean protein, and whole grains. Try to get some exercise each day. Aim for 30 minutes of exercise on most days of the week. Keep a consistent sleep schedule. Try to get 8 or more hours of sleep each night. Start doing the things that you used to enjoy. Do not use drugs or alcohol to ease your symptoms. Spend time with friends and loved ones. General instructions Take over-the-counter and prescription medicines only as told by your health care provider. Consider joining a grief (bereavement) support group to help you deal with your loss. Keep all follow-up visits. This is important.   Contact a health care provider if: Your symptoms prevent you from functioning normally. Your symptoms do not get better with treatment. Get help right away if: You have serious thoughts about hurting yourself or someone else. You have suicidal feelings. Get help right away if you feel like you may hurt yourself or others, or have thoughts about taking your own life.  Go to your nearest emergency room or: Call 911. Call the National Suicide Prevention Lifeline at 1-800-273-8255 or 988. This is open 24 hours a day. Text the Crisis Text Line at 741741. Summary Prolonged grief is a severe type of grief that lasts for a long time. This grief is not likely to go away on its own. Get the help you need. Some griefs are more difficult than others and can cause this condition. You may need a certain type of treatment to help you recover if the loss of your loved one was sudden, violent, or due to suicide. You may feel guilty about moving on with your life. Getting help does not mean that you are forgetting your loved one. It means that you are taking care of yourself. Prolonged grief is best treated with talk therapy. Medicines may also be prescribed. Seek the help you need, and find support that will help you recover. This information is not intended to replace advice given to you by your health care provider. Make sure you discuss any questions you have with your health care provider. Document Revised: 01/09/2021 Document Reviewed: 01/09/2021 Elsevier Patient Education  2023 Elsevier Inc.  

## 2021-10-16 ENCOUNTER — Other Ambulatory Visit: Payer: Self-pay | Admitting: Nurse Practitioner

## 2021-10-16 DIAGNOSIS — F4321 Adjustment disorder with depressed mood: Secondary | ICD-10-CM

## 2021-11-09 ENCOUNTER — Telehealth: Payer: Managed Care, Other (non HMO) | Admitting: Physician Assistant

## 2021-11-09 DIAGNOSIS — J069 Acute upper respiratory infection, unspecified: Secondary | ICD-10-CM | POA: Diagnosis not present

## 2021-11-09 DIAGNOSIS — B9689 Other specified bacterial agents as the cause of diseases classified elsewhere: Secondary | ICD-10-CM

## 2021-11-09 MED ORDER — DOXYCYCLINE HYCLATE 100 MG PO TABS: 100.0000 mg | ORAL_TABLET | Freq: Two times a day (BID) | ORAL | 0 refills | Status: DC

## 2021-11-09 MED ORDER — BENZONATATE 100 MG PO CAPS: 100.0000 mg | ORAL_CAPSULE | Freq: Three times a day (TID) | ORAL | 0 refills | Status: DC | PRN

## 2021-11-09 NOTE — Progress Notes (Signed)
E-Visit for Sinus Problems  We are sorry that you are not feeling well.  Here is how we plan to help!  Based on what you have shared with me it looks like you have sinusitis.  Sinusitis is inflammation and infection in the sinus cavities of the head.  Based on your presentation I believe you most likely have Acute Bacterial Sinusitis.  This is an infection caused by bacteria and is treated with antibiotics. I have prescribed Doxycycline 100mg  by mouth twice a day for 10 days. I have also sent in a prescription cough medication, benzonatate, for you to take as directed. You may use an oral decongestant such as Mucinex D or if you have glaucoma or high blood pressure use plain Mucinex. Saline nasal spray help and can safely be used as often as needed for congestion.  If you develop worsening sinus pain, fever or notice severe headache and vision changes, or if symptoms are not better after completion of antibiotic, please schedule an appointment with a health care provider.    Sinus infections are not as easily transmitted as other respiratory infection, however we still recommend that you avoid close contact with loved ones, especially the very young and elderly.  Remember to wash your hands thoroughly throughout the day as this is the number one way to prevent the spread of infection!  Home Care: Only take medications as instructed by your medical team. Complete the entire course of an antibiotic. Do not take these medications with alcohol. A steam or ultrasonic humidifier can help congestion.  You can place a towel over your head and breathe in the steam from hot water coming from a faucet. Avoid close contacts especially the very young and the elderly. Cover your mouth when you cough or sneeze. Always remember to wash your hands.  Get Help Right Away If: You develop worsening fever or sinus pain. You develop a severe head ache or visual changes. Your symptoms persist after you have completed  your treatment plan.  Make sure you Understand these instructions. Will watch your condition. Will get help right away if you are not doing well or get worse.  Thank you for choosing an e-visit.  Your e-visit answers were reviewed by a board certified advanced clinical practitioner to complete your personal care plan. Depending upon the condition, your plan could have included both over the counter or prescription medications.  Please review your pharmacy choice. Make sure the pharmacy is open so you can pick up prescription now. If there is a problem, you may contact your provider through and have the prescription routed to another pharmacy.  Your safety is important to [employer]. If you have drug allergies check your prescription carefully.   For the next 24 hours you can use MyChart to ask questions about today's visit, request a non-urgent call back, or ask for a work or school excuse. You will get an email in the next two days asking about your experience. I hope that your e-visit has been valuable and will speed your recovery.

## 2021-11-09 NOTE — Progress Notes (Signed)
I have spent 5 minutes in review of e-visit questionnaire, review and updating patient chart, medical decision making and response to patient.   Axzel Rockhill Cody Masin Shatto, PA-C    

## 2022-01-16 ENCOUNTER — Encounter: Payer: Self-pay | Admitting: Nurse Practitioner

## 2022-01-16 ENCOUNTER — Ambulatory Visit: Payer: 59 | Admitting: Nurse Practitioner

## 2022-01-16 VITALS — BP 156/89 | HR 69 | Temp 97.6°F | Resp 20 | Ht 66.0 in | Wt 144.0 lb

## 2022-01-16 DIAGNOSIS — F339 Major depressive disorder, recurrent, unspecified: Secondary | ICD-10-CM

## 2022-01-16 DIAGNOSIS — G43809 Other migraine, not intractable, without status migrainosus: Secondary | ICD-10-CM

## 2022-01-16 DIAGNOSIS — K219 Gastro-esophageal reflux disease without esophagitis: Secondary | ICD-10-CM | POA: Diagnosis not present

## 2022-01-16 DIAGNOSIS — J3089 Other allergic rhinitis: Secondary | ICD-10-CM

## 2022-01-16 DIAGNOSIS — E0500 Thyrotoxicosis with diffuse goiter without thyrotoxic crisis or storm: Secondary | ICD-10-CM | POA: Diagnosis not present

## 2022-01-16 DIAGNOSIS — B029 Zoster without complications: Secondary | ICD-10-CM

## 2022-01-16 DIAGNOSIS — B001 Herpesviral vesicular dermatitis: Secondary | ICD-10-CM

## 2022-01-16 MED ORDER — GABAPENTIN 100 MG PO CAPS: 100.0000 mg | ORAL_CAPSULE | Freq: Three times a day (TID) | ORAL | 3 refills | Status: DC

## 2022-01-16 MED ORDER — VALACYCLOVIR HCL 1 G PO TABS: 1000.0000 mg | ORAL_TABLET | Freq: Three times a day (TID) | ORAL | 1 refills | Status: DC

## 2022-01-16 NOTE — Patient Instructions (Signed)
Prolonged Grief Grief is a normal response to the death of someone close to you. Feelings of fear, anger, and guilt can affect almost everyone who loses a loved one. It is also common to have symptoms of depression while you are grieving. These include problems with sleep, loss of appetite, and lack of energy. They may last for weeks or months after a loss. Prolonged grief is different from normal grief or depression. Normal grieving involves sadness and feelings of loss, but those feelings get better and heal over time. Prolonged grief is a severe type of grief that lasts for a long time, usually for several months to a year or longer. It interferes with your ability to function normally. Prolonged grief may require treatment from a mental health care provider. What are the causes? The cause of this condition is not known. It is not clear why some people continue to struggle with grief and others do not. What increases the risk? You are more likely to develop this condition if: The death of your loved one was sudden or unexpected. The death of your loved one was due to a violent event. Your loved one died from suicide. Your loved one was a child or a young person. You were very close to your loved one, or you were dependent on him or her. You have a history of depression or anxiety. You have very little to no support from others. What are the signs or symptoms? Symptoms of this condition include: Feeling disbelief or having a lack of emotion (numbness). Being unable to enjoy good memories of your loved one. Needing to avoid anything or anyone that reminds you of your loved one. Being unable to stop thinking about the death. Feeling intense anger, loneliness, helplessness, or guilt. Feeling that your life is meaningless and empty, and having trouble moving on with your life. How is this diagnosed? This condition may be diagnosed based on: Your symptoms. Prolonged grief will be diagnosed if  you have ongoing symptoms of grief for 6 months for children and 12 months or longer for adults. The effect of symptoms on your life. You may be diagnosed with this condition if your symptoms are interfering with your ability to live your life. Your health care provider may recommend that you see a mental health care provider. Many symptoms of depression are similar to the symptoms of prolonged grief. It is important to be evaluated for prolonged grief along with other mental health conditions. How is this treated? This condition is most commonly treated with talk therapy. This therapy is offered by a mental health specialist (psychiatrist). During therapy: You will learn healthy ways to cope with the loss of your loved one. Your mental health care provider may recommend antidepressant medicines. Follow these instructions at home: Lifestyle  Take care of yourself. Eat on a regular basis, and maintain a healthy diet. Eat plenty of fruits, vegetables, lean protein, and whole grains. Try to get some exercise each day. Aim for 30 minutes of exercise on most days of the week. Keep a consistent sleep schedule. Try to get 8 or more hours of sleep each night. Start doing the things that you used to enjoy. Do not use drugs or alcohol to ease your symptoms. Spend time with friends and loved ones. General instructions Take over-the-counter and prescription medicines only as told by your health care provider. Consider joining a grief (bereavement) support group to help you deal with your loss. Keep all follow-up visits. This is important.   Contact a health care provider if: Your symptoms prevent you from functioning normally. Your symptoms do not get better with treatment. Get help right away if: You have serious thoughts about hurting yourself or someone else. You have suicidal feelings. Get help right away if you feel like you may hurt yourself or others, or have thoughts about taking your own life.  Go to your nearest emergency room or: Call 911. Call the National Suicide Prevention Lifeline at 1-800-273-8255 or 988. This is open 24 hours a day. Text the Crisis Text Line at 741741. Summary Prolonged grief is a severe type of grief that lasts for a long time. This grief is not likely to go away on its own. Get the help you need. Some griefs are more difficult than others and can cause this condition. You may need a certain type of treatment to help you recover if the loss of your loved one was sudden, violent, or due to suicide. You may feel guilty about moving on with your life. Getting help does not mean that you are forgetting your loved one. It means that you are taking care of yourself. Prolonged grief is best treated with talk therapy. Medicines may also be prescribed. Seek the help you need, and find support that will help you recover. This information is not intended to replace advice given to you by your health care provider. Make sure you discuss any questions you have with your health care provider. Document Revised: 01/09/2021 Document Reviewed: 01/09/2021 Elsevier Patient Education  2023 Elsevier Inc.  

## 2022-01-16 NOTE — Progress Notes (Signed)
 Subjective:    Patient ID: Alexandria Ingram, female    DOB: 06/26/1960, 60 y.o.   MRN: [id number]   Chief Complaint: medical management of chronic issues     HPI:  Alexandria Ingram is a 60 y.o. who identifies as a female who was assigned female at birth.   Social history: Lives with:  Work history: works for [employer]   Comes in today for follow up of the following chronic medical issues:  1. Gastroesophageal reflux disease without esophagitis Is on no prescription meds. Uses OTC when needed.  2. Graves disease No problems that she is aware of.   3. Depression, recurrent (HCC) Is on celexa- has had some grief issues lately and has been out of work some. She also has buspar to take and ususlaly takes daily.she is still grieving over the death of her husband and her son in the last year    01/16/2022    9:56 AM 09/20/2021    1:51 PM 07/18/2021    2:57 PM  Depression screen PHQ 2/9  Decreased Interest 2 2 1  Down, Depressed, Hopeless 2 2 1  PHQ - 2 Score 4 4 2  Altered sleeping 2 1 1  Tired, decreased energy 2 1 1  Change in appetite 2 3 1  Feeling bad or failure about yourself  0 0 0  Trouble concentrating 0 2 0  Moving slowly or fidgety/restless 0 0 0  Suicidal thoughts 0 0 0  PHQ-9 Score 10 11 5  Difficult doing work/chores Somewhat difficult  Somewhat difficult     4. Other migraine without status migrainosus, not intractable Is on topamax and still has occasional migraines.   5. Chronic nonseasonal allergic rhinitis due to pollen Use OTC meds  6.  Herpes zoster Is on valtrex daily. Takes nerontin when she has flare up to help with pain.  7. Low back pain Is on soma as needed. Has not needed as of late.    New complaints: none  Allergies  Allergen Reactions   Latex Swelling   Povidone Iodine Rash   Outpatient Encounter Medications as of 01/16/2022  Medication Sig   acyclovir ointment (ZOVIRAX) 5 % APPLY TO AFFECTED AREA EVERY NIGHT   benzonatate  (TESSALON) 100 MG capsule Take 1 capsule (100 mg total) by mouth 3 (three) times daily as needed for cough.   betamethasone dipropionate 0.05 % cream APPLY TO AFFECTED AREA TWICE A DAY   busPIRone (BUSPAR) 10 MG tablet TAKE 1 TABLET BY MOUTH TWICE A DAY   carisoprodol (SOMA) 350 MG tablet TAKE 1 TABLET BY MOUTH 4 TIMES A DAY AS NEEDED FOR MUSCLE SPASMS   citalopram (CELEXA) 10 MG tablet TAKE 1 TABLET BY MOUTH EVERY DAY   doxycycline (VIBRA-TABS) 100 MG tablet Take 1 tablet (100 mg total) by mouth 2 (two) times daily.   estradiol (ESTRACE) 1 MG tablet Take 1 tablet (1 mg total) by mouth daily.   fluticasone (FLONASE) 50 MCG/ACT nasal spray Place 2 sprays into both nostrils daily.   gabapentin (NEURONTIN) 100 MG capsule Take 1 capsule (100 mg total) by mouth 3 (three) times daily.   medroxyPROGESTERone (PROVERA) 5 MG tablet TAKE 1 TABLET (5 MG TOTAL) BY MOUTH DAILY.   topiramate (TOPAMAX) 50 MG tablet Take 1 tablet (50 mg total) by mouth at bedtime as needed.   valACYclovir (VALTREX) 1000 MG tablet Take 1 tablet (1,000 mg total) by mouth 3 (three) times daily.   No facility-administered encounter medications   on file as of 01/16/2022.    No past surgical history on file.  No family history on file.    Controlled substance contract: n/a     Review of Systems  Constitutional:  Negative for diaphoresis.  Eyes:  Negative for pain.  Respiratory:  Negative for shortness of breath.   Cardiovascular:  Negative for chest pain, palpitations and leg swelling.  Gastrointestinal:  Negative for abdominal pain.  Endocrine: Negative for polydipsia.  Skin:  Negative for rash.  Neurological:  Negative for dizziness, weakness and headaches.  Hematological:  Does not bruise/bleed easily.  All other systems reviewed and are negative.      Objective:   Physical Exam Vitals and nursing note reviewed.  Constitutional:      General: She is not in acute distress.    Appearance: Normal appearance. She  is well-developed.  HENT:     Head: Normocephalic.     Right Ear: Tympanic membrane normal.     Left Ear: Tympanic membrane normal.     Nose: Nose normal.     Mouth/Throat:     Mouth: Mucous membranes are moist.  Eyes:     Pupils: Pupils are equal, round, and reactive to light.  Neck:     Vascular: No carotid bruit or JVD.  Cardiovascular:     Rate and Rhythm: Normal rate and regular rhythm.     Heart sounds: Normal heart sounds.  Pulmonary:     Effort: Pulmonary effort is normal. No respiratory distress.     Breath sounds: Normal breath sounds. No wheezing or rales.  Chest:     Chest wall: No tenderness.  Abdominal:     General: Bowel sounds are normal. There is no distension or abdominal bruit.     Palpations: Abdomen is soft. There is no hepatomegaly, splenomegaly, mass or pulsatile mass.     Tenderness: There is no abdominal tenderness.  Musculoskeletal:        General: Normal range of motion.     Cervical back: Normal range of motion and neck supple.  Lymphadenopathy:     Cervical: No cervical adenopathy.  Skin:    General: Skin is warm and dry.  Neurological:     Mental Status: She is alert and oriented to person, place, and time.     Deep Tendon Reflexes: Reflexes are normal and symmetric.  Psychiatric:        Behavior: Behavior normal.        Thought Content: Thought content normal.        Judgment: Judgment normal.     BP (!) 156/89   Pulse 69   Temp 97.6 F (36.4 C) (Temporal)   Resp 20   Ht 5' 6" (1.676 m)   Wt 144 lb (65.3 kg)   LMP 04/02/2015   SpO2 100%   BMI 23.24 kg/m        Assessment & Plan:   Alexandria Ingram comes in today with chief complaint of Medical Management of Chronic Issues (Constant nasal drainage and mosquito bites)   Diagnosis and orders addressed:  1. Gastroesophageal reflux disease without esophagitis Avoid spicy foods Do not eat 2 hours prior to bedtime   2. Graves disease Labs pending - Thyroid Panel With  TSH  3. Depression, recurrent ([place]) Stress management  4. Other migraine without status migrainosus, not intractable Avoid caffeine - CBC with Differential/Platelet - CMP14+EGFR - Lipid panel  5. Chronic nonseasonal allergic rhinitis due to pollen Continue Flonase Add claritin OTC  6. Fever blister - valACYclovir (VALTREX) 1000 MG tablet; Take 1 tablet (1,000 mg total) by mouth 3 (three) times daily.  Dispense: 270 tablet; Refill: 1  7. Herpes zoster without complication - gabapentin (NEURONTIN) 100 MG capsule; Take 1 capsule (100 mg total) by mouth 3 (three) times daily.  Dispense: 90 capsule; Refill: 3   Labs pending Health Maintenance reviewed Diet and exercise encouraged  Follow up plan: 6 months   Mary-Margaret Martin, FNP  

## 2022-01-18 NOTE — Telephone Encounter (Signed)
Please print and add to health maintenance

## 2022-03-15 ENCOUNTER — Encounter: Payer: Managed Care, Other (non HMO) | Admitting: Physician Assistant

## 2022-03-15 ENCOUNTER — Telehealth: Payer: Managed Care, Other (non HMO) | Admitting: Physician Assistant

## 2022-03-15 DIAGNOSIS — J020 Streptococcal pharyngitis: Secondary | ICD-10-CM | POA: Diagnosis not present

## 2022-03-15 MED ORDER — AMOXICILLIN 500 MG PO CAPS: 500.0000 mg | ORAL_CAPSULE | Freq: Two times a day (BID) | ORAL | 0 refills | Status: AC

## 2022-03-15 NOTE — Progress Notes (Signed)
Duplicate encounter; already treated

## 2022-03-15 NOTE — Progress Notes (Signed)

## 2022-03-15 NOTE — Telephone Encounter (Signed)
This encounter was created in error - please disregard.

## 2022-03-22 ENCOUNTER — Telehealth: Payer: 59 | Admitting: Physician Assistant

## 2022-03-22 DIAGNOSIS — R059 Cough, unspecified: Secondary | ICD-10-CM

## 2022-03-22 MED ORDER — BENZONATATE 100 MG PO CAPS
100.0000 mg | ORAL_CAPSULE | Freq: Three times a day (TID) | ORAL | 0 refills | Status: AC
Start: 2022-03-22 — End: 2022-03-27

## 2022-03-22 NOTE — Progress Notes (Signed)
We are sorry that you are not feeling well.  Here is how we plan to help!  Based on your presentation I believe you most likely have A cough due to a virus.  This is called viral bronchitis and is best treated by rest, plenty of fluids and control of the cough.  You may use Ibuprofen or Tylenol as directed to help your symptoms.     In addition you may use A prescription cough medication called Tessalon Perles '100mg'$ . You may take 1-2 capsules every 8 hours as needed for your cough.   From your responses in the eVisit questionnaire you describe inflammation in the upper respiratory tract which is causing a significant cough.  This is commonly called Bronchitis and has four common causes:   Allergies Viral Infections Acid Reflux Bacterial Infection Allergies, viruses and acid reflux are treated by controlling symptoms or eliminating the cause. An example might be a cough caused by taking certain blood pressure medications. You stop the cough by changing the medication. Another example might be a cough caused by acid reflux. Controlling the reflux helps control the cough.  USE OF BRONCHODILATOR ("RESCUE") INHALERS: There is a risk from using your bronchodilator too frequently.  The risk is that over-reliance on a medication which only relaxes the muscles surrounding the breathing tubes can reduce the effectiveness of medications prescribed to reduce swelling and congestion of the tubes themselves.  Although you feel brief relief from the bronchodilator inhaler, your asthma may actually be worsening with the tubes becoming more swollen and filled with mucus.  This can delay other crucial treatments, such as oral steroid medications. If you need to use a bronchodilator inhaler daily, several times per day, you should discuss this with your provider.  There are probably better treatments that could be used to keep your asthma under control.     HOME CARE Only take medications as instructed by your  medical team. Complete the entire course of an antibiotic. Drink plenty of fluids and get plenty of rest. Avoid close contacts especially the very young and the elderly Cover your mouth if you cough or cough into your sleeve. Always remember to wash your hands A steam or ultrasonic humidifier can help congestion.   GET HELP RIGHT AWAY IF: You develop worsening fever. You become short of breath You cough up blood. Your symptoms persist after you have completed your treatment plan MAKE SURE YOU  Understand these instructions. Will watch your condition. Will get help right away if you are not doing well or get worse.    Thank you for choosing an e-visit.  Your e-visit answers were reviewed by a board certified advanced clinical practitioner to complete your personal care plan. Depending upon the condition, your plan could have included both over the counter or prescription medications.  Please review your pharmacy choice. Make sure the pharmacy is open so you can pick up prescription now. If there is a problem, you may contact your provider through [employer] and have the prescription routed to another pharmacy.  Your safety is important to [place]. If you have drug allergies check your prescription carefully.   For the next 24 hours you can use MyChart to ask questions about today's visit, request a non-urgent call back, or ask for a work or school excuse. You will get an email in the next two days asking about your experience. I hope that your e-visit has been valuable and will speed your recovery.  Approximately 5 minutes was  spent documenting and reviewing patient's chart.

## 2022-03-28 ENCOUNTER — Encounter: Payer: Self-pay | Admitting: Nurse Practitioner

## 2022-03-28 ENCOUNTER — Other Ambulatory Visit: Payer: Self-pay | Admitting: Nurse Practitioner

## 2022-03-28 ENCOUNTER — Telehealth: Payer: 59 | Admitting: Nurse Practitioner

## 2022-03-28 ENCOUNTER — Telehealth: Payer: Self-pay

## 2022-03-28 DIAGNOSIS — R062 Wheezing: Secondary | ICD-10-CM | POA: Diagnosis not present

## 2022-03-28 DIAGNOSIS — J22 Unspecified acute lower respiratory infection: Secondary | ICD-10-CM

## 2022-03-28 MED ORDER — FLUTICASONE PROPIONATE 50 MCG/ACT NA SUSP: 2.0000 | Freq: Every day | NASAL | 6 refills | Status: DC

## 2022-03-28 MED ORDER — GUAIFENESIN ER 600 MG PO TB12: 600.0000 mg | ORAL_TABLET | Freq: Two times a day (BID) | ORAL | 0 refills | Status: DC

## 2022-03-28 MED ORDER — PREDNISONE 10 MG (21) PO TBPK: ORAL_TABLET | ORAL | 0 refills | Status: DC

## 2022-03-28 MED ORDER — ALBUTEROL SULFATE HFA 108 (90 BASE) MCG/ACT IN AERS: 2.0000 | INHALATION_SPRAY | Freq: Four times a day (QID) | RESPIRATORY_TRACT | 2 refills | Status: DC | PRN

## 2022-03-28 NOTE — Telephone Encounter (Signed)
levalbuterol (XOPENEX HFA) 45 MCG/ACT inhaler        Changed from: albuterol (VENTOLIN HFA) 108 (90 Base) MCG/ACT inhaler   Pharmacy comment: Alternative Requested:NOT COVERED.

## 2022-03-28 NOTE — Progress Notes (Signed)
Virtual Visit  Note Due to COVID-19 pandemic this visit was conducted virtually. This visit type was conducted due to national recommendations for restrictions regarding the COVID-19 Pandemic (e.g. social distancing, sheltering in place) in an effort to limit this patient's exposure and mitigate transmission in our community. All issues noted in this document were discussed and addressed.  A physical exam was not performed with this format.  I connected with Alexandria Ingram on 03/28/22 at 11;40 am by telephone and verified that I am speaking with the correct person using two identifiers. Alexandria Ingram is currently located remote site during visit. The provider, Daryll Drown, NP is located in their office at time of visit.  I discussed the limitations, risks, security and privacy concerns of performing an evaluation and management service by telephone and the availability of in person appointments. I also discussed with the patient that there may be a patient responsible charge related to this service. The patient expressed understanding and agreed to proceed.   History and Present Illness:  URI  This is a recurrent problem. The current episode started 1 to 4 weeks ago. The problem has been gradually worsening. There has been no fever. Associated symptoms include congestion, coughing, headaches, a sore throat and wheezing. Pertinent negatives include no chest pain, rash or sinus pain. She has tried decongestant and acetaminophen (antibiotics) for the symptoms. The treatment provided mild relief.  Wheezing  This is a new problem. The current episode started yesterday. The problem occurs constantly. The problem has been unchanged. Associated symptoms include coughing, headaches, shortness of breath and a sore throat. Pertinent negatives include no chest pain or rash.      Review of Systems  Constitutional: Negative.   HENT:  Positive for congestion and sore throat. Negative for sinus pain.    Eyes: Negative.   Respiratory:  Positive for cough, shortness of breath and wheezing.   Cardiovascular:  Negative for chest pain.  Skin: Negative.  Negative for itching and rash.  Neurological:  Positive for headaches.  All other systems reviewed and are negative.    Observations/Objective: Televisit patient not in distress.  Assessment and Plan: Patient presents with ongoing respiratory infection.  Completed antibiotics the past few weeks with no resolution.  Patient is reporting copious phlegm, congestion.  She denies fever, body aches and flulike symptoms.  Completed chest x-ray results pending.  Take meds as prescribed - Use a cool mist humidifier  -Use saline nose sprays frequently -Force fluids -For fever or aches or pains- take Tylenol or ibuprofen. -If symptoms do not improve, she may need to be COVID tested to rule this out   Follow Up Instructions: Follow-up with unresolved symptoms.    I discussed the assessment and treatment plan with the patient. The patient was provided an opportunity to ask questions and all were answered. The patient agreed with the plan and demonstrated an understanding of the instructions.   The patient was advised to call back or seek an in-person evaluation if the symptoms worsen or if the condition fails to improve as anticipated.  The above assessment and management plan was discussed with the patient. The patient verbalized understanding of and has agreed to the management plan. Patient is aware to call the clinic if symptoms persist or worsen. Patient is aware when to return to the clinic for a follow-up visit. Patient educated on when it is appropriate to go to the emergency department.   Time call ended: 11:48 AM.  I  provided 8 minutes of  non face-to-face time during this encounter.    Ivy Lynn, NP

## 2022-03-28 NOTE — Telephone Encounter (Signed)
Yes okay for note 

## 2022-03-30 ENCOUNTER — Ambulatory Visit (INDEPENDENT_AMBULATORY_CARE_PROVIDER_SITE_OTHER): Payer: 59

## 2022-03-30 DIAGNOSIS — J22 Unspecified acute lower respiratory infection: Secondary | ICD-10-CM

## 2022-04-09 ENCOUNTER — Other Ambulatory Visit: Payer: Self-pay | Admitting: Nurse Practitioner

## 2022-04-09 DIAGNOSIS — B029 Zoster without complications: Secondary | ICD-10-CM

## 2022-04-16 ENCOUNTER — Other Ambulatory Visit: Payer: Self-pay | Admitting: Family Medicine

## 2022-05-25 ENCOUNTER — Ambulatory Visit: Payer: 59 | Admitting: Nurse Practitioner

## 2022-05-25 ENCOUNTER — Encounter: Payer: 59 | Admitting: Nurse Practitioner

## 2022-05-29 ENCOUNTER — Encounter: Payer: Self-pay | Admitting: Nurse Practitioner

## 2022-05-29 ENCOUNTER — Ambulatory Visit (INDEPENDENT_AMBULATORY_CARE_PROVIDER_SITE_OTHER): Payer: 59 | Admitting: Nurse Practitioner

## 2022-05-29 VITALS — BP 135/87 | HR 93 | Temp 97.7°F | Resp 20 | Ht 66.0 in | Wt 141.0 lb

## 2022-05-29 DIAGNOSIS — N950 Postmenopausal bleeding: Secondary | ICD-10-CM | POA: Diagnosis not present

## 2022-05-29 DIAGNOSIS — F4321 Adjustment disorder with depressed mood: Secondary | ICD-10-CM

## 2022-05-29 NOTE — Progress Notes (Signed)
Subjective:    Patient ID: Alexandria Ingram, female    DOB: Sep 28, 1960, 61 y.o.   MRN: [id number]   Chief Complaint: Vaginal Bleeding (Last period was 2021)   Vaginal Bleeding Pertinent negatives include no abdominal pain, headaches or rash.   Patient stopped having periods in 2010 in mid 40's. She had some spotting in 2020 after her husband passed away. AYetta Barre had out her on estrogen in 2015. Now she is having full period with heavy bleeding for 5 days. This was on December 4th.  Still having grief issues- she is on buspar. Refuses antidepressant. She says she is stillgrieving daily over the death of husband and son. Sh eis seeing a therapist.    01/16/2022    9:56 AM 09/20/2021    1:51 PM 07/18/2021    2:57 PM  Depression screen PHQ 2/9  Decreased Interest 2 2 1   Down, Depressed, Hopeless 2 2 1   PHQ - 2 Score 4 4 2   Altered sleeping 2 1 1   Tired, decreased energy 2 1 1   Change in appetite 2 3 1   Feeling bad or failure about yourself  0 0 0  Trouble concentrating 0 2 0  Moving slowly or fidgety/restless 0 0 0  Suicidal thoughts 0 0 0  PHQ-9 Score 10 11 5   Difficult doing work/chores Somewhat difficult  Somewhat difficult    Review of Systems  Constitutional:  Negative for diaphoresis.  Eyes:  Negative for pain.  Respiratory:  Negative for shortness of breath.   Cardiovascular:  Negative for chest pain, palpitations and leg swelling.  Gastrointestinal:  Negative for abdominal pain.  Endocrine: Negative for polydipsia.  Genitourinary:  Positive for vaginal bleeding.  Skin:  Negative for rash.  Neurological:  Negative for dizziness, weakness and headaches.  Hematological:  Does not bruise/bleed easily.  All other systems reviewed and are negative.      Objective:   Physical Exam Constitutional:      Appearance: Normal appearance.  Cardiovascular:     Rate and Rhythm: Normal rate and regular rhythm.     Heart sounds: Normal heart sounds.  Pulmonary:     Effort:  Pulmonary effort is normal.     Breath sounds: Normal breath sounds.  Skin:    General: Skin is warm.  Neurological:     General: No focal deficit present.     Mental Status: She is alert and oriented to person, place, and time.  Psychiatric:        Mood and Affect: Mood normal.        Behavior: Behavior normal.     BP 135/87   Pulse 93   Temp 97.7 F (36.5 C) (Temporal)   Resp 20   Ht 5\' 6"  (1.676 m)   Wt 141 lb (64 kg)   LMP 04/02/2015   SpO2 97%   BMI 22.76 kg/m        Assessment & Plan:   in today with chief complaint of Vaginal Bleeding (Last period was 2021)   1. Postmenopausal bleeding Referral to GYN- will need endometrial bx - Ambulatory referral to Gynecology  2. Grieving Continue buspar as prescribed Keep follow  up with counselor.    The above assessment and management plan was discussed with the patient. The patient verbalized understanding of and has agreed to the management plan. Patient is aware to call the clinic if symptoms persist or worsen. Patient is aware when to return to the clinic for a  follow-up visit. Patient educated on when it is appropriate to go to the emergency department.   Mary-Margaret Hassell Done, FNP

## 2022-05-29 NOTE — Patient Instructions (Signed)
Postmenopausal Bleeding Postmenopausal bleeding is any bleeding that occurs after menopause. Menopause is a time in a woman's life when monthly periods stop. Any type of bleeding after menopause should be checked by your doctor. Treatment will depend on the cause. This kind of bleeding can be caused by: Taking hormones during menopause. Low or high amounts of female hormones in the body. This can cause the lining of the womb (uterus) to become too thin or too thick. Cancer. Growths in the womb that are not cancer. Follow these instructions at home:  Watch for any changes in your symptoms. Let your doctor know about them. Avoid using tampons and douches as told by your doctor. Change your pads regularly. Get regular pelvic exams. This includes Pap tests. Take iron pills as told by your doctor. Take over-the-counter and prescription medicines only as told by your doctor. Keep all follow-up visits. Contact a doctor if: You have new bleeding from the vagina after menopause. You have pain in your belly (abdomen). Get help right away if: You have a fever or chills. You have very bad pain with bleeding. You have clumps of blood (blood clots) coming from your vagina. You have a lot of bleeding, and: You use more than 1 pad an hour. This kind of bleeding has never happened before. You have headaches. You feel dizzy or you feel like you are going to pass out (faint). Summary Any type of bleeding after menopause should be checked by your doctor. Avoid using tampons or douches. Get regular pelvic exams. This includes Pap tests. Contact a doctor if you have new bleeding or pain in your belly. Watch for any changes in your symptoms. Let your doctor know about them. This information is not intended to replace advice given to you by your health care provider. Make sure you discuss any questions you have with your health care provider. Document Revised: 11/05/2019 Document Reviewed:  11/05/2019 Elsevier Patient Education  2023 Elsevier Inc.  

## 2022-06-07 ENCOUNTER — Other Ambulatory Visit: Payer: Self-pay | Admitting: Nurse Practitioner

## 2022-06-07 DIAGNOSIS — N950 Postmenopausal bleeding: Secondary | ICD-10-CM

## 2022-06-11 NOTE — Telephone Encounter (Signed)
Please send referral information to patient

## 2022-07-05 ENCOUNTER — Other Ambulatory Visit: Payer: Self-pay | Admitting: Nurse Practitioner

## 2022-07-05 DIAGNOSIS — B029 Zoster without complications: Secondary | ICD-10-CM

## 2022-07-05 NOTE — Telephone Encounter (Signed)
Last office visit 05/29/22 Upcoming appointment 07/24/22 Last refills: Betamethasone 09/11/21, 60 grams 2 refills Acyclovir 04/09/22, 30 grams 1 refill

## 2022-07-24 ENCOUNTER — Encounter: Payer: Self-pay | Admitting: Nurse Practitioner

## 2022-07-24 ENCOUNTER — Ambulatory Visit: Payer: 59 | Admitting: Nurse Practitioner

## 2022-07-24 VITALS — BP 144/96 | HR 91 | Temp 97.8°F | Resp 20 | Ht 66.0 in | Wt 141.0 lb

## 2022-07-24 DIAGNOSIS — K219 Gastro-esophageal reflux disease without esophagitis: Secondary | ICD-10-CM | POA: Diagnosis not present

## 2022-07-24 DIAGNOSIS — F339 Major depressive disorder, recurrent, unspecified: Secondary | ICD-10-CM | POA: Diagnosis not present

## 2022-07-24 DIAGNOSIS — G43809 Other migraine, not intractable, without status migrainosus: Secondary | ICD-10-CM | POA: Diagnosis not present

## 2022-07-24 DIAGNOSIS — E0500 Thyrotoxicosis with diffuse goiter without thyrotoxic crisis or storm: Secondary | ICD-10-CM | POA: Diagnosis not present

## 2022-07-24 DIAGNOSIS — M545 Low back pain, unspecified: Secondary | ICD-10-CM

## 2022-07-24 DIAGNOSIS — B029 Zoster without complications: Secondary | ICD-10-CM

## 2022-07-24 MED ORDER — CARISOPRODOL 350 MG PO TABS: ORAL_TABLET | ORAL | 1 refills | Status: DC

## 2022-07-24 MED ORDER — TOPIRAMATE 50 MG PO TABS: 50.0000 mg | ORAL_TABLET | Freq: Every evening | ORAL | 1 refills | Status: DC | PRN

## 2022-07-24 MED ORDER — GABAPENTIN 100 MG PO CAPS: 100.0000 mg | ORAL_CAPSULE | Freq: Three times a day (TID) | ORAL | 1 refills | Status: DC

## 2022-07-24 NOTE — Patient Instructions (Signed)

## 2022-07-24 NOTE — Progress Notes (Signed)
Subjective:    Patient ID: Alexandria Ingram, female    DOB: 1961-02-28, 62 y.o.   MRN: [id number]   Chief Complaint: medical management of chronic issues     HPI:  Alexandria Ingram is a 61 y.o. who identifies as a female who was assigned female at birth.   Social history:  Work history: works for Alexandria Ingram in today for follow up of the following chronic medical issues:  1. Gastroesophageal reflux disease without esophagitis Has been doing well without meds  2. Depression, recurrent (Alexandria Ingram) Is really struggling with her depression. She has had several deaths in the family which has made things very difficult. She has had to be out of work several times due to stress. She was on buspar for a short period of time but is currently not taking that.     07/24/2022    2:49 PM 01/16/2022    9:56 AM 09/20/2021    1:51 PM  Depression screen PHQ 2/9  Decreased Interest 2 2 2  $ Down, Depressed, Hopeless 0 2 2  PHQ - 2 Score 2 4 4  $ Altered sleeping 0 2 1  Tired, decreased energy 1 2 1  $ Change in appetite 1 2 3  $ Feeling bad or failure about yourself  0 0 0  Trouble concentrating 0 0 2  Moving slowly or fidgety/restless 0 0 0  Suicidal thoughts 0 0 0  PHQ-9 Score 4 10 11  $ Difficult doing work/chores Not difficult at all Somewhat difficult      3. Graves disease No issues that she is aware of. [facility]  4. Other migraine without status migrainosus, not intractable Is on topamax and is doing well.  5. Chronic low back pain Has been on soma for years which really helps. Added neurontin several year sago. Says pain is minimal most of the time.  New complaints: None today  Allergies  Allergen Reactions   Latex Swelling   Povidone Iodine Rash   Outpatient Encounter Medications as of 07/24/2022  Medication Sig   acyclovir ointment (ZOVIRAX) 5 % APPLY TO AFFECTED AREA EVERY NIGHT   betamethasone dipropionate 0.05 % cream APPLY TO AFFECTED AREA TWICE A DAY    busPIRone (BUSPAR) 10 MG tablet TAKE 1 TABLET BY MOUTH TWICE A DAY   carisoprodol (SOMA) 350 MG tablet TAKE 1 TABLET BY MOUTH 4 TIMES A DAY AS NEEDED FOR MUSCLE SPASMS   estradiol (ESTRACE) 1 MG tablet Take 1 tablet (1 mg total) by mouth daily.   fluticasone (FLONASE) 50 MCG/ACT nasal spray Place 2 sprays into both nostrils daily.   gabapentin (NEURONTIN) 100 MG capsule Take 1 capsule (100 mg total) by mouth 3 (three) times daily.   medroxyPROGESTERone (PROVERA) 5 MG tablet TAKE 1 TABLET (5 MG TOTAL) BY MOUTH DAILY.   topiramate (TOPAMAX) 50 MG tablet Take 1 tablet (50 mg total) by mouth at bedtime as needed.   valACYclovir (VALTREX) 1000 MG tablet Take 1 tablet (1,000 mg total) by mouth 3 (three) times daily.   No facility-administered encounter medications on file as of 07/24/2022.    No past surgical history on file.  No family history on file.    Controlled substance contract: n/a     Review of Systems  Constitutional:  Negative for diaphoresis.  Eyes:  Negative for pain.  Respiratory:  Negative for shortness of breath.   Cardiovascular:  Negative for chest pain, palpitations and leg swelling.  Gastrointestinal:  Negative  for abdominal pain.  Endocrine: Negative for polydipsia.  Skin:  Negative for rash.  Neurological:  Negative for dizziness, weakness and headaches.  Hematological:  Does not bruise/bleed easily.  All other systems reviewed and are negative.      Objective:   Physical Exam Vitals and nursing note reviewed.  Constitutional:      General: She is not in acute distress.    Appearance: Normal appearance. She is well-developed.  HENT:     Head: Normocephalic.     Right Ear: Tympanic membrane normal.     Left Ear: Tympanic membrane normal.     Nose: Nose normal.     Mouth/Throat:     Mouth: Mucous membranes are moist.  Eyes:     Pupils: Pupils are equal, round, and reactive to light.  Neck:     Vascular: No carotid bruit or JVD.  Cardiovascular:      Rate and Rhythm: Normal rate and regular rhythm.     Heart sounds: Normal heart sounds.  Pulmonary:     Effort: Pulmonary effort is normal. No respiratory distress.     Breath sounds: Normal breath sounds. No wheezing or rales.  Chest:     Chest wall: No tenderness.  Abdominal:     General: Bowel sounds are normal. There is no distension or abdominal bruit.     Palpations: Abdomen is soft. There is no hepatomegaly, splenomegaly, mass or pulsatile mass.     Tenderness: There is no abdominal tenderness.  Musculoskeletal:        General: Normal range of motion.     Cervical back: Normal range of motion and neck supple.  Lymphadenopathy:     Cervical: No cervical adenopathy.  Skin:    General: Skin is warm and dry.  Neurological:     Mental Status: She is alert and oriented to person, place, and time.     Deep Tendon Reflexes: Reflexes are normal and symmetric.  Psychiatric:        Behavior: Behavior normal.        Thought Content: Thought content normal.        Judgment: Judgment normal.     BP (!) 144/96   Pulse 91   Temp 97.8 F (36.6 C) (Temporal)   Resp 20   Ht 5' 6"$  (1.676 m)   Wt 141 lb (64 kg)   LMP 04/02/2015   SpO2 100%   BMI 22.76 kg/m        Assessment & Plan:   Alexandria Ingram in today with chief complaint of Medical Management of Chronic Issues   1. Gastroesophageal reflux disease without esophagitis Avoid spicy foods Do not eat 2 hours prior to bedtime   2. Depression, recurrent (Alexandria Ingram) Stress management  3. Graves disease Labs pending  4. Other migraine without status migrainosus, not intractable Avoid migraine - topiramate (TOPAMAX) 50 MG tablet; Take 1 tablet (50 mg total) by mouth at bedtime as needed.  Dispense: 90 tablet; Refill: 1  5. Acute midline low back pain without sciatica Moist heat - carisoprodol (SOMA) 350 MG tablet; TAKE 1 TABLET BY MOUTH 4 TIMES A DAY AS NEEDED FOR MUSCLE SPASMS  Dispense: 40 tablet; Refill: 1  6.  Herpes zoster without complication - gabapentin (NEURONTIN) 100 MG capsule; Take 1 capsule (100 mg total) by mouth 3 (three) times daily.  Dispense: 90 capsule; Refill: 1    The above assessment and management plan was discussed with the patient. The patient verbalized understanding of and  has agreed to the management plan. Patient is aware to call the clinic if symptoms persist or worsen. Patient is aware when to return to the clinic for a follow-up visit. Patient educated on when it is appropriate to go to the emergency department.   Mary-Margaret Hassell Done, FNP

## 2022-08-09 ENCOUNTER — Telehealth (INDEPENDENT_AMBULATORY_CARE_PROVIDER_SITE_OTHER): Payer: 59 | Admitting: Nurse Practitioner

## 2022-08-09 ENCOUNTER — Encounter: Payer: Self-pay | Admitting: Nurse Practitioner

## 2022-08-09 DIAGNOSIS — F4321 Adjustment disorder with depressed mood: Secondary | ICD-10-CM | POA: Diagnosis not present

## 2022-08-09 DIAGNOSIS — F419 Anxiety disorder, unspecified: Secondary | ICD-10-CM

## 2022-08-09 NOTE — Patient Instructions (Signed)
Managing Loss, Adult People experience loss in many different ways throughout their lives. Events such as moving, changing jobs, and losing friends can create a sense of loss. The loss may be as serious as a major health change, divorce, death of a pet, or death of a loved one. All of these types of loss are likely to create a physical and emotional reaction known as grief. Grief is the result of a major change or an absence of something or someone that you count on. Grief is a normal reaction to loss. A variety of factors can affect your grieving experience, including: The nature of your loss. Your relationship to what or whom you lost. Your understanding of grief and how to manage it. Your support system. Be aware that when grief becomes extreme, it can lead to more severe issues like isolation, depression, anxiety, or suicidal thoughts. Talk with your health care provider if you have any of these issues. How to manage lifestyle changes Keep to your normal routine as much as possible. If you have trouble focusing or doing normal activities, it is acceptable to take some time away from your normal routine. Spend time with friends and loved ones. Eat a healthy diet, get plenty of sleep, and rest when you feel tired. How to recognize changes  The way that you deal with your grief will affect your ability to function as you normally do. When grieving, you may experience these changes: Numbness, shock, sadness, anxiety, anger, denial, and guilt. Thoughts about death. Unexpected crying. A physical sensation of emptiness in your stomach. Problems sleeping and eating. Tiredness (fatigue). Loss of interest in normal activities. Dreaming about or imagining seeing the person who died. A need to remember what or whom you lost. Difficulty thinking about anything other than your loss for a period of time. Relief. If you have been expecting the loss for a while, you may feel a sense of relief when it  happens. Follow these instructions at home: Activity Express your feelings in healthy ways, such as: Talking with others about your loss. It may be helpful to find others who have had a similar loss, such as a support group. Writing down your feelings in a journal. Doing physical activities to release stress and emotional energy. Doing creative activities like painting, sculpting, or playing or listening to music. Practicing resilience. This is the ability to recover and adjust after facing challenges. Reading some resources that encourage resilience may help you to learn ways to practice those behaviors.  General instructions Be patient with yourself and others. Allow the grieving process to happen, and remember that grieving takes time. It is likely that you may never feel completely done with some grief. You may find a way to move on while still cherishing memories and feelings about your loss. Accepting your loss is a process. It can take months or longer to adjust. Keep all follow-up visits. This is important. Where to find support To get support for managing loss: Ask your health care provider for help and recommendations, such as grief counseling or therapy. Think about joining a support group for people who are managing a loss. Where to find more information You can find more information about managing loss from: American Society of Clinical Oncology: www.cancer.net American Psychological Association: www.apa.org Contact a health care provider if: Your grief is extreme and keeps getting worse. You have ongoing grief that does not improve. Your body shows symptoms of grief, such as illness. You feel depressed, anxious, or   hopeless. Get help right away if: You have thoughts about hurting yourself or others. Get help right away if you feel like you may hurt yourself or others, or have thoughts about taking your own life. Go to your nearest emergency room or: Call 911. Call the  National Suicide Prevention Lifeline at 1-800-273-8255 or 988. This is open 24 hours a day. Text the Crisis Text Line at 741741. Summary Grief is the result of a major change or an absence of someone or something that you count on. Grief is a normal reaction to loss. The depth of grief and the period of recovery depend on the type of loss and your ability to adjust to the change and process your feelings. Processing grief requires patience and a willingness to accept your feelings and talk about your loss with people who are supportive. It is important to find resources that work for you and to realize that people experience grief differently. There is not one grieving process that works for everyone in the same way. Be aware that when grief becomes extreme, it can lead to more severe issues like isolation, depression, anxiety, or suicidal thoughts. Talk with your health care provider if you have any of these issues. This information is not intended to replace advice given to you by your health care provider. Make sure you discuss any questions you have with your health care provider. Document Revised: 01/09/2021 Document Reviewed: 01/09/2021 Elsevier Patient Education  2023 Elsevier Inc.  

## 2022-08-09 NOTE — Progress Notes (Signed)
Virtual Visit Consent   Alexandria Ingram, you are scheduled for a virtual visit with Mary-Margaret Hassell Done, [name], a [facility] provider, today.     Just as with appointments in the office, your consent must be obtained to participate.  Your consent will be active for this visit and any virtual visit you may have with one of our providers in the next 365 days.     If you have a MyChart account, a copy of this consent can be sent to you electronically.  All virtual visits are billed to your insurance company just like a traditional visit in the office.    As this is a virtual visit, video technology does not allow for your provider to perform a traditional examination.  This may limit your provider's ability to fully assess your condition.  If your provider identifies any concerns that need to be evaluated in person or the need to arrange testing (such as labs, EKG, etc.), we will make arrangements to do so.     Although advances in technology are sophisticated, we cannot ensure that it will always work on either your end or our end.  If the connection with a video visit is poor, the visit may have to be switched to a telephone visit.  With either a video or telephone visit, we are not always able to ensure that we have a secure connection.     I need to obtain your verbal consent now.   Are you willing to proceed with your visit today? YES   Alexandria Ingram has provided verbal consent on 08/09/2022 for a virtual visit (video or telephone).   Mary-Margaret Hassell Done, FNP   Date: 08/09/2022 12:02 PM   Virtual Visit via Video Note   I, Mary-Margaret Hassell Done, connected with Alexandria Ingram ([id number], 20-Apr-1961) on 08/09/22 at 12:15 PM EST by a video-enabled telemedicine application and verified that I am speaking with the correct person using two identifiers.  Location: Patient: Virtual Visit Location Patient: Home Provider: Virtual Visit Location Provider: Mobile   I discussed the limitations of  evaluation and management by telemedicine and the availability of in person appointments. The patient expressed understanding and agreed to proceed.    History of Present Illness: Alexandria Ingram is a 62 y.o. who identifies as a female who was assigned female at birth, and is being seen today for anxiety and depression.  HPI:  Patient was seen on 07/24/22 for chronic followup. She was doing well then . The first of march she had and anxiety attack and was not abe to go to work. She has FMLA for theses episodes. She has been out of work since Friday August 03, 2022. Today she is better.  Still grieving over death of son and husband. She is ready to go back  to work and needs note.  Review of Systems  Psychiatric/Behavioral:  The patient is nervous/anxious.     Problems:  Patient Active Problem List   Diagnosis Date Noted   Chronic pain of left knee 01/06/2019   Left knee injury 07/23/2018   Hot flashes 08/01/2016   Depression, recurrent ([place]) 08/01/2016   Migraine 08/01/2016   Menopausal vasomotor syndrome 03/27/2016   Chronic nonseasonal allergic rhinitis due to pollen 03/27/2016   Abnormal skin of vulva 03/27/2016   Abnormal skin color 03/27/2016   Gastroesophageal reflux disease without esophagitis 03/27/2016   Fever blister 03/27/2016   Graves disease 02/10/2012    Allergies:  Allergies  Allergen Reactions  Latex Swelling   Povidone Iodine Rash   Medications:  Current Outpatient Medications:    acyclovir ointment (ZOVIRAX) 5 %, APPLY TO AFFECTED AREA EVERY NIGHT, Disp: 30 g, Rfl: 1   betamethasone dipropionate 0.05 % cream, APPLY TO AFFECTED AREA TWICE A DAY, Disp: 60 g, Rfl: 2   busPIRone (BUSPAR) 10 MG tablet, TAKE 1 TABLET BY MOUTH TWICE A DAY, Disp: 180 tablet, Rfl: 0   carisoprodol (SOMA) 350 MG tablet, TAKE 1 TABLET BY MOUTH 4 TIMES A DAY AS NEEDED FOR MUSCLE SPASMS, Disp: 40 tablet, Rfl: 1   estradiol (ESTRACE) 1 MG tablet, Take 1 tablet (1 mg total) by mouth daily.,  Disp: 90 tablet, Rfl: 3   fluticasone (FLONASE) 50 MCG/ACT nasal spray, Place 2 sprays into both nostrils daily., Disp: 16 g, Rfl: 6   gabapentin (NEURONTIN) 100 MG capsule, Take 1 capsule (100 mg total) by mouth 3 (three) times daily., Disp: 90 capsule, Rfl: 1   medroxyPROGESTERone (PROVERA) 5 MG tablet, TAKE 1 TABLET (5 MG TOTAL) BY MOUTH DAILY., Disp: 90 tablet, Rfl: 0   topiramate (TOPAMAX) 50 MG tablet, Take 1 tablet (50 mg total) by mouth at bedtime as needed., Disp: 90 tablet, Rfl: 1   valACYclovir (VALTREX) 1000 MG tablet, Take 1 tablet (1,000 mg total) by mouth 3 (three) times daily., Disp: 270 tablet, Rfl: 1  Observations/Objective: Patient is well-developed, well-nourished in no acute distress.  Resting comfortably  at home.  Head is normocephalic, atraumatic.  No labored breathing.  Speech is clear and coherent with logical content.  Patient is alert and oriented at baseline.    Assessment and Plan:  Alexandria Ingram in today with chief complaint of Anxiety and grief   1. Anxiety 2. Grief Stress management Return to counseling Continue all meds    Follow Up Instructions: I discussed the assessment and treatment plan with the patient. The patient was provided an opportunity to ask questions and all were answered. The patient agreed with the plan and demonstrated an understanding of the instructions.  A copy of instructions were sent to the patient via MyChart.  The patient was advised to call back or seek an in-person evaluation if the symptoms worsen or if the condition fails to improve as anticipated.  Time:  I spent 13 minutes with the patient via telehealth technology discussing the above problems/concerns.    Mary-Margaret Hassell Done, FNP

## 2022-09-04 ENCOUNTER — Other Ambulatory Visit: Payer: Self-pay | Admitting: Nurse Practitioner

## 2022-09-06 ENCOUNTER — Telehealth: Payer: Self-pay | Admitting: Urgent Care

## 2022-09-06 DIAGNOSIS — B349 Viral infection, unspecified: Secondary | ICD-10-CM

## 2022-09-06 MED ORDER — FLUTICASONE PROPIONATE 50 MCG/ACT NA SUSP: 2.0000 | Freq: Every day | NASAL | 0 refills | Status: AC

## 2022-09-06 MED ORDER — GUAIFENESIN ER 600 MG PO TB12: 600.0000 mg | ORAL_TABLET | Freq: Two times a day (BID) | ORAL | 0 refills | Status: DC | PRN

## 2022-09-06 NOTE — Progress Notes (Signed)
E visit for Flu like symptoms   We are sorry that you are not feeling well.  Here is how we plan to help! Based on what you have shared with me it looks like you may have flu-like symptoms that should be watched but do not seem to indicate anti-viral treatment.  Influenza or "the flu" is   an infection caused by a respiratory virus. The flu virus is highly contagious and persons who did not receive their yearly flu vaccination may "catch" the flu from close contact.  We have anti-viral medications to treat the viruses that cause this infection. They are not a "cure" and only shorten the course of the infection. These prescriptions are most effective when they are given within the first 2 days of "flu" symptoms. Antiviral medication are indicated if you have a high risk of complications from the flu. You should  also consider an antiviral medication if you are in close contact with someone who is at risk. These medications can help patients avoid complications from the flu  but have side effects that you should know. Possible side effects from Tamiflu or oseltamivir include nausea, vomiting, diarrhea, dizziness, headaches, eye redness, sleep problems or other respiratory symptoms. You should not take Tamiflu if you have an allergy to oseltamivir or any to the ingredients in Tamiflu.  Based upon your symptoms and potential risk factors I recommend that you follow the flu symptoms recommendation that I have listed below.  Please purchase oscillococcinum, an OTC medication that helps with body aches. You may also use the flonase and mucinex called in to help break up the mucous and phlegm.  ANYONE WHO HAS FLU SYMPTOMS SHOULD: Stay home. The flu is highly contagious and going out or to work exposes others! Be sure to drink plenty of fluids. Water is fine as well as fruit juices, sodas and electrolyte beverages. You may want to stay away from caffeine or alcohol. If you are nauseated, try taking small  sips of liquids. How do you know if you are getting enough fluid? Your urine should be a pale yellow or almost colorless. Get rest. Taking a steamy shower or using a humidifier may help nasal congestion and ease sore throat pain. Using a saline nasal spray works much the same way. Cough drops, hard candies and sore throat lozenges may ease your cough. Line up a caregiver. Have someone check on you regularly.   GET HELP RIGHT AWAY IF: You cannot keep down liquids or your medications. You become short of breath Your fell like you are going to pass out or loose consciousness. Your symptoms persist after you have completed your treatment plan MAKE SURE YOU  Understand these instructions. Will watch your condition. Will get help right away if you are not doing well or get worse.  Your e-visit answers were reviewed by a board certified advanced clinical practitioner to complete your personal care plan.  Depending on the condition, your plan could have included both over the counter or prescription medications.  If there is a problem please reply  once you have received a response from your provider.  Your safety is important to [place].  If you have drug allergies check your prescription carefully.    You can use MyChart to ask questions about today's visit, request a non-urgent call back, or ask for a work or school excuse for 24 hours related to this e-Visit. If it has been greater than 24 hours you will need to follow up with  your provider, or enter a new e-Visit to address those concerns.  You will get an e-mail in the next two days asking about your experience.  I hope that your e-visit has been valuable and will speed your recovery. Thank you for using e-visits.   I have spent 5 minutes in review of e-visit questionnaire, review and updating patient chart, medical decision making and response to patient.   [place], PA

## 2022-09-07 ENCOUNTER — Telehealth: Payer: 59 | Admitting: Nurse Practitioner

## 2022-09-07 ENCOUNTER — Encounter: Payer: Self-pay | Admitting: Nurse Practitioner

## 2022-09-07 DIAGNOSIS — J069 Acute upper respiratory infection, unspecified: Secondary | ICD-10-CM | POA: Diagnosis not present

## 2022-09-07 MED ORDER — CHLORPHEN-PE-ACETAMINOPHEN 4-10-325 MG PO TABS
1.0000 | ORAL_TABLET | Freq: Four times a day (QID) | ORAL | 0 refills | Status: DC | PRN
Start: 2022-09-07 — End: 2023-08-09

## 2022-09-07 MED ORDER — AMOXICILLIN-POT CLAVULANATE 875-125 MG PO TABS
1.0000 | ORAL_TABLET | Freq: Two times a day (BID) | ORAL | 0 refills | Status: DC
Start: 2022-09-07 — End: 2023-02-26

## 2022-09-07 NOTE — Patient Instructions (Signed)
Alexandria Ingram, thank you for joining Bennie Pierini, FNP for today's virtual visit.  While this provider is not your primary care provider (PCP), if your PCP is located in our provider database this encounter information will be shared with them immediately following your visit.   A [facility] MyChart account gives you access to today's visit and all your visits, tests, and labs performed at [facility] " click here if you don't have a [facility] MyChart account or go to [email or web address]  Consent: (Patient) Alexandria Ingram provided verbal consent for this virtual visit at the beginning of the encounter.  Current Medications:  Current Outpatient Medications:    amoxicillin-clavulanate (AUGMENTIN) 875-125 MG tablet, Take 1 tablet by mouth 2 (two) times daily., Disp: 14 tablet, Rfl: 0   Chlorphen-PE-Acetaminophen 4-10-325 MG TABS, Take 1 tablet by mouth every 6 (six) hours as needed., Disp: 20 tablet, Rfl: 0   acyclovir ointment (ZOVIRAX) 5 %, APPLY TO AFFECTED AREA EVERY NIGHT, Disp: 30 g, Rfl: 1   betamethasone dipropionate 0.05 % cream, APPLY TO AFFECTED AREA TWICE A DAY, Disp: 60 g, Rfl: 2   busPIRone (BUSPAR) 10 MG tablet, TAKE 1 TABLET BY MOUTH TWICE A DAY, Disp: 180 tablet, Rfl: 0   carisoprodol (SOMA) 350 MG tablet, TAKE 1 TABLET BY MOUTH 4 TIMES A DAY AS NEEDED FOR MUSCLE SPASMS, Disp: 40 tablet, Rfl: 1   estradiol (ESTRACE) 1 MG tablet, TAKE 1 TABLET BY MOUTH EVERY DAY, Disp: 90 tablet, Rfl: 1   fluticasone (FLONASE) 50 MCG/ACT nasal spray, Place 2 sprays into both nostrils daily., Disp: 16 g, Rfl: 0   gabapentin (NEURONTIN) 100 MG capsule, Take 1 capsule (100 mg total) by mouth 3 (three) times daily., Disp: 90 capsule, Rfl: 1   guaiFENesin (MUCINEX) 600 MG 12 hr tablet, Take 1 tablet (600 mg total) by mouth 2 (two) times daily as needed for cough or to loosen phlegm., Disp: 20 tablet, Rfl: 0   medroxyPROGESTERone (PROVERA) 5 MG tablet, TAKE 1 TABLET (5 MG  TOTAL) BY MOUTH DAILY., Disp: 90 tablet, Rfl: 1   topiramate (TOPAMAX) 50 MG tablet, Take 1 tablet (50 mg total) by mouth at bedtime as needed., Disp: 90 tablet, Rfl: 1   valACYclovir (VALTREX) 1000 MG tablet, Take 1 tablet (1,000 mg total) by mouth 3 (three) times daily., Disp: 270 tablet, Rfl: 1   Medications ordered in this encounter:  Meds ordered this encounter  Medications   amoxicillin-clavulanate (AUGMENTIN) 875-125 MG tablet    Sig: Take 1 tablet by mouth 2 (two) times daily.    Dispense:  14 tablet    Refill:  0    Order Specific Question:   Supervising Provider    Answer:   Arville Care A [[id number]]   Chlorphen-PE-Acetaminophen 4-10-325 MG TABS    Sig: Take 1 tablet by mouth every 6 (six) hours as needed.    Dispense:  20 tablet    Refill:  0    Order Specific Question:   Supervising Provider    Answer:   Arville Care A [[id number]]     *If you need refills on other medications prior to your next appointment, please contact your pharmacy*  Follow-Up: Call back or seek an in-person evaluation if the symptoms worsen or if the condition fails to improve as anticipated.  [facility] [phone]  Other Instructions 1. Take meds as prescribed 2. Use a cool mist humidifier especially during the winter months and when heat has been humid.  3. Use saline nose sprays frequently 4. Saline irrigations of the nose can be very helpful if done frequently.  * 4X daily for 1 week*  * Use of a nettie pot can be helpful with this. Follow directions with this* 5. Drink plenty of fluids 6. Keep thermostat turn down low 7.For any cough or congestion- mucinex 8. For fever or aces or pains- take tylenol or ibuprofen appropriate for age and weight.  * for fevers greater than 101 orally you may alternate ibuprofen and tylenol every  3 hours.      If you have been instructed to have an in-person evaluation today at a local Urgent Care facility, please use the link  below. It will take you to a list of all of our available [facility], including address, phone number and hours of operation. Please do not delay care.  [facility]  If you or a family member do not have a primary care provider, use the link below to schedule a visit and establish care. When you choose a [facility] primary care physician or advanced practice provider, you gain a long-term partner in health. Find a Primary Care Provider  Learn more about [facility]'s in-office and virtual care options: [facility] Get [facility] Now

## 2022-09-07 NOTE — Progress Notes (Signed)
Virtual Visit Consent   Alexandria HerringYvette R Ingram, you are scheduled for a virtual visit with [name], FNP, a [facility] provider, today.     Just as with appointments in the office, your consent must be obtained to participate.  Your consent will be active for this visit and any virtual visit you may have with one of our providers in the next 365 days.     If you have a MyChart account, a copy of this consent can be sent to you electronically.  All virtual visits are billed to your insurance company just like a traditional visit in the office.    As this is a virtual visit, video technology does not allow for your provider to perform a traditional examination.  This may limit your provider's ability to fully assess your condition.  If your provider identifies any concerns that need to be evaluated in person or the need to arrange testing (such as labs, EKG, etc.), we will make arrangements to do so.     Although advances in technology are sophisticated, we cannot ensure that it will always work on either your end or our end.  If the connection with a video visit is poor, the visit may have to be switched to a telephone visit.  With either a video or telephone visit, we are not always able to ensure that we have a secure connection.     I need to obtain your verbal consent now.   Are you willing to proceed with your visit today? YES   Alexandria Ingram has provided verbal consent on 09/07/2022 for a virtual visit (video or telephone).   [name], FNP   Date: 09/07/2022 10:51 AM   Virtual Visit via Video Note   I, [name], connected with Alexandria Ingram ([id number], 1960/12/20) on 09/07/22 at  4:45 PM EDT by a video-enabled telemedicine application and verified that I am speaking with the correct person using two identifiers.  Location: Patient: Virtual Visit Location Patient: Home Provider: Virtual Visit Location Provider: Mobile   I discussed the limitations of  evaluation and management by telemedicine and the availability of in person appointments. The patient expressed understanding and agreed to proceed.    History of Present Illness: Alexandria HerringYvette R Mitchener is a 62 y.o. who identifies as a female who was assigned female at birth, and is being seen today for uri.  HPI: URI  This is a new problem. The current episode started in the past 7 days. The problem has been waxing and waning. There has been no fever. Associated symptoms include congestion, coughing, headaches, rhinorrhea and a sore throat. Pertinent negatives include no sinus pain. She has tried NSAIDs and decongestant for the symptoms. The treatment provided mild relief.    Review of Systems  HENT:  Positive for congestion, rhinorrhea and sore throat. Negative for sinus pain.   Respiratory:  Positive for cough.   Neurological:  Positive for headaches.    Problems:  Patient Active Problem List   Diagnosis Date Noted   Chronic pain of left knee 01/06/2019   Left knee injury 07/23/2018   Hot flashes 08/01/2016   Depression, recurrent 08/01/2016   Migraine 08/01/2016   Menopausal vasomotor syndrome 03/27/2016   Chronic nonseasonal allergic rhinitis due to pollen 03/27/2016   Abnormal skin of vulva 03/27/2016   Abnormal skin color 03/27/2016   Gastroesophageal reflux disease without esophagitis 03/27/2016   Fever blister 03/27/2016   Graves disease 02/10/2012    Allergies:  Allergies  Allergen Reactions   Latex Swelling   Povidone Iodine Rash   Medications:  Current Outpatient Medications:    acyclovir ointment (ZOVIRAX) 5 %, APPLY TO AFFECTED AREA EVERY NIGHT, Disp: 30 g, Rfl: 1   betamethasone dipropionate 0.05 % cream, APPLY TO AFFECTED AREA TWICE A DAY, Disp: 60 g, Rfl: 2   busPIRone (BUSPAR) 10 MG tablet, TAKE 1 TABLET BY MOUTH TWICE A DAY, Disp: 180 tablet, Rfl: 0   carisoprodol (SOMA) 350 MG tablet, TAKE 1 TABLET BY MOUTH 4 TIMES A DAY AS NEEDED FOR MUSCLE SPASMS, Disp: 40  tablet, Rfl: 1   estradiol (ESTRACE) 1 MG tablet, TAKE 1 TABLET BY MOUTH EVERY DAY, Disp: 90 tablet, Rfl: 1   fluticasone (FLONASE) 50 MCG/ACT nasal spray, Place 2 sprays into both nostrils daily., Disp: 16 g, Rfl: 0   gabapentin (NEURONTIN) 100 MG capsule, Take 1 capsule (100 mg total) by mouth 3 (three) times daily., Disp: 90 capsule, Rfl: 1   guaiFENesin (MUCINEX) 600 MG 12 hr tablet, Take 1 tablet (600 mg total) by mouth 2 (two) times daily as needed for cough or to loosen phlegm., Disp: 20 tablet, Rfl: 0   medroxyPROGESTERone (PROVERA) 5 MG tablet, TAKE 1 TABLET (5 MG TOTAL) BY MOUTH DAILY., Disp: 90 tablet, Rfl: 1   topiramate (TOPAMAX) 50 MG tablet, Take 1 tablet (50 mg total) by mouth at bedtime as needed., Disp: 90 tablet, Rfl: 1   valACYclovir (VALTREX) 1000 MG tablet, Take 1 tablet (1,000 mg total) by mouth 3 (three) times daily., Disp: 270 tablet, Rfl: 1  Observations/Objective: Patient is well-developed, well-nourished in no acute distress.  Resting comfortably  at home.  Head is normocephalic, atraumatic.  No labored breathing.  Speech is clear and coherent with logical content.  Patient is alert and oriented at baseline.  Post oral pharynx red Cough   Assessment and Plan:  Alexandria Herring in today with chief complaint of URI   1. URI with cough and congestion 1. Take meds as prescribed 2. Use a cool mist humidifier especially during the winter months and when heat has been humid. 3. Use saline nose sprays frequently 4. Saline irrigations of the nose can be very helpful if done frequently.  * 4X daily for 1 week*  * Use of a nettie pot can be helpful with this. Follow directions with this* 5. Drink plenty of fluids 6. Keep thermostat turn down low 7.For any cough or congestion- mucinex  8. For fever or aces or pains- take tylenol or ibuprofen appropriate for age and weight.  * for fevers greater than 101 orally you may alternate ibuprofen and tylenol every  3  hours.    - amoxicillin-clavulanate (AUGMENTIN) 875-125 MG tablet; Take 1 tablet by mouth 2 (two) times daily.  Dispense: 14 tablet; Refill: 0 - Chlorphen-PE-Acetaminophen 4-10-325 MG TABS; Take 1 tablet by mouth every 6 (six) hours as needed.  Dispense: 20 tablet; Refill: 0    The above assessment and management plan was discussed with the patient. The patient verbalized understanding of and has agreed to the management plan. Patient is aware to call the clinic if symptoms persist or worsen. Patient is aware when to return to the clinic for a follow-up visit. Patient educated on when it is appropriate to go to the emergency department.   Mary-Margaret Daphine Deutscher, FNP    Follow Up Instructions: I discussed the assessment and treatment plan with the patient. The patient was provided an opportunity to ask questions  and all were answered. The patient agreed with the plan and demonstrated an understanding of the instructions.  A copy of instructions were sent to the patient via MyChart.  The patient was advised to call back or seek an in-person evaluation if the symptoms worsen or if the condition fails to improve as anticipated.  Time:  I spent 6 minutes with the patient via telehealth technology discussing the above problems/concerns.    [name], FNP

## 2023-01-21 ENCOUNTER — Telehealth: Payer: Self-pay | Admitting: Nurse Practitioner

## 2023-01-21 DIAGNOSIS — Z0279 Encounter for issue of other medical certificate: Secondary | ICD-10-CM

## 2023-01-21 NOTE — Telephone Encounter (Signed)
Alexandria Ingram  dropped off FMLA forms to be completed and signed.  Form Fee Paid? (Y/N)       YES     If NO, form is placed on front office manager desk to hold until payment received. If YES, then form will be placed in the RX/HH Nurse Coordinators box for completion.  Form will not be processed until payment is received

## 2023-01-24 NOTE — Telephone Encounter (Signed)
PCP completed and signed FMLA forms. They have been faxed to [employer] at fax number [phone]. LMOVM and informed they are complete.

## 2023-02-26 ENCOUNTER — Telehealth (INDEPENDENT_AMBULATORY_CARE_PROVIDER_SITE_OTHER): Payer: 59 | Admitting: Nurse Practitioner

## 2023-02-26 ENCOUNTER — Encounter: Payer: Self-pay | Admitting: Nurse Practitioner

## 2023-02-26 DIAGNOSIS — J069 Acute upper respiratory infection, unspecified: Secondary | ICD-10-CM

## 2023-02-26 MED ORDER — AMOXICILLIN-POT CLAVULANATE 875-125 MG PO TABS: 1.0000 | ORAL_TABLET | Freq: Two times a day (BID) | ORAL | 0 refills | Status: DC

## 2023-02-26 MED ORDER — PREDNISONE 20 MG PO TABS: 40.0000 mg | ORAL_TABLET | Freq: Every day | ORAL | 0 refills | Status: AC

## 2023-02-26 NOTE — Patient Instructions (Signed)
Alexandria Ingram, thank you for joining Bennie Pierini, FNP for today's virtual visit.  While this provider is not your primary care provider (PCP), if your PCP is located in our provider database this encounter information will be shared with them immediately following your visit.   A [facility] MyChart account gives you access to today's visit and all your visits, tests, and labs performed at [facility] " click here if you don't have a [facility] MyChart account or go to [email or web address]  Consent: (Patient) Alexandria Ingram provided verbal consent for this virtual visit at the beginning of the encounter.  Current Medications:  Current Outpatient Medications:    amoxicillin-clavulanate (AUGMENTIN) 875-125 MG tablet, Take 1 tablet by mouth 2 (two) times daily., Disp: 14 tablet, Rfl: 0   predniSONE (DELTASONE) 20 MG tablet, Take 2 tablets (40 mg total) by mouth daily with breakfast for 5 days. 2 po daily for 5 days, Disp: 10 tablet, Rfl: 0   acyclovir ointment (ZOVIRAX) 5 %, APPLY TO AFFECTED AREA EVERY NIGHT, Disp: 30 g, Rfl: 1   betamethasone dipropionate 0.05 % cream, APPLY TO AFFECTED AREA TWICE A DAY, Disp: 60 g, Rfl: 2   busPIRone (BUSPAR) 10 MG tablet, TAKE 1 TABLET BY MOUTH TWICE A DAY, Disp: 180 tablet, Rfl: 0   carisoprodol (SOMA) 350 MG tablet, TAKE 1 TABLET BY MOUTH 4 TIMES A DAY AS NEEDED FOR MUSCLE SPASMS, Disp: 40 tablet, Rfl: 1   Chlorphen-PE-Acetaminophen 4-10-325 MG TABS, Take 1 tablet by mouth every 6 (six) hours as needed., Disp: 20 tablet, Rfl: 0   estradiol (ESTRACE) 1 MG tablet, TAKE 1 TABLET BY MOUTH EVERY DAY, Disp: 90 tablet, Rfl: 1   fluticasone (FLONASE) 50 MCG/ACT nasal spray, Place 2 sprays into both nostrils daily., Disp: 16 g, Rfl: 0   gabapentin (NEURONTIN) 100 MG capsule, Take 1 capsule (100 mg total) by mouth 3 (three) times daily., Disp: 90 capsule, Rfl: 1   guaiFENesin (MUCINEX) 600 MG 12 hr tablet, Take 1 tablet (600 mg total) by  mouth 2 (two) times daily as needed for cough or to loosen phlegm., Disp: 20 tablet, Rfl: 0   medroxyPROGESTERone (PROVERA) 5 MG tablet, TAKE 1 TABLET (5 MG TOTAL) BY MOUTH DAILY., Disp: 90 tablet, Rfl: 1   topiramate (TOPAMAX) 50 MG tablet, Take 1 tablet (50 mg total) by mouth at bedtime as needed., Disp: 90 tablet, Rfl: 1   valACYclovir (VALTREX) 1000 MG tablet, Take 1 tablet (1,000 mg total) by mouth 3 (three) times daily., Disp: 270 tablet, Rfl: 1   Medications ordered in this encounter:  Meds ordered this encounter  Medications   amoxicillin-clavulanate (AUGMENTIN) 875-125 MG tablet    Sig: Take 1 tablet by mouth 2 (two) times daily.    Dispense:  14 tablet    Refill:  0    Order Specific Question:   Supervising Provider    Answer:   Arville Care A [[id number]]   predniSONE (DELTASONE) 20 MG tablet    Sig: Take 2 tablets (40 mg total) by mouth daily with breakfast for 5 days. 2 po daily for 5 days    Dispense:  10 tablet    Refill:  0    Order Specific Question:   Supervising Provider    Answer:   Arville Care A [[id number]]     *If you need refills on other medications prior to your next appointment, please contact your pharmacy*  Follow-Up: Call back or seek an in-person evaluation if the  symptoms worsen or if the condition fails to improve as anticipated.  [facility] [phone]  Other Instructions 1. Take meds as prescribed 2. Use a cool mist humidifier especially during the winter months and when heat has been humid. 3. Use saline nose sprays frequently 4. Saline irrigations of the nose can be very helpful if done frequently.  * 4X daily for 1 week*  * Use of a nettie pot can be helpful with this. Follow directions with this* 5. Drink plenty of fluids 6. Keep thermostat turn down low 7.For any cough or congestion- mucinex or delsym OTC 8. For fever or aces or pains- take tylenol or ibuprofen appropriate for age and weight.  * for fevers greater  than 101 orally you may alternate ibuprofen and tylenol every  3 hours.      If you have been instructed to have an in-person evaluation today at a local Urgent Care facility, please use the link below. It will take you to a list of all of our available [facility], including address, phone number and hours of operation. Please do not delay care.  [facility]  If you or a family member do not have a primary care provider, use the link below to schedule a visit and establish care. When you choose a [facility] primary care physician or advanced practice provider, you gain a long-term partner in health. Find a Primary Care Provider  Learn more about [facility]'s in-office and virtual care options: [facility] - Get Care Now

## 2023-02-26 NOTE — Progress Notes (Signed)
Virtual Visit Consent   Alexandria Ingram, you are scheduled for a virtual visit with Mary-Margaret Daphine Deutscher, FNP, a [facility] provider, today.     Just as with appointments in the office, your consent must be obtained to participate.  Your consent will be active for this visit and any virtual visit you may have with one of our providers in the next 365 days.     If you have a MyChart account, a copy of this consent can be sent to you electronically.  All virtual visits are billed to your insurance company just like a traditional visit in the office.    As this is a virtual visit, video technology does not allow for your provider to perform a traditional examination.  This may limit your provider's ability to fully assess your condition.  If your provider identifies any concerns that need to be evaluated in person or the need to arrange testing (such as labs, EKG, etc.), we will make arrangements to do so.     Although advances in technology are sophisticated, we cannot ensure that it will always work on either your end or our end.  If the connection with a video visit is poor, the visit may have to be switched to a telephone visit.  With either a video or telephone visit, we are not always able to ensure that we have a secure connection.     I need to obtain your verbal consent now.   Are you willing to proceed with your visit today? YES   Alexandria Ingram has provided verbal consent on 02/26/2023 for a virtual visit (video or telephone).   Mary-Margaret Daphine Deutscher, FNP   Date: 02/26/2023 1:35 PM   Virtual Visit via Video Note   I, Mary-Margaret Daphine Deutscher, connected with Alexandria Ingram ([id number], 07/05/1960) on 02/26/23 at  6:00 PM EDT by a video-enabled telemedicine application and verified that I am speaking with the correct person using two identifiers.  Location: Patient: Virtual Visit Location Patient: Home Provider: Virtual Visit Location Provider: Mobile   I discussed the limitations of  evaluation and management by telemedicine and the availability of in person appointments. The patient expressed understanding and agreed to proceed.    History of Present Illness: Alexandria Ingram is a 62 y.o. who identifies as a female who was assigned female at birth, and is being seen today for uri.  HPI: URI  This is a new problem. The current episode started in the past 7 days. The problem has been gradually worsening. There has been no fever. Associated symptoms include congestion, coughing, headaches, rhinorrhea, sinus pain and swollen glands. Pertinent negatives include no sore throat. She has tried acetaminophen for the symptoms. The treatment provided mild relief.    Review of Systems  HENT:  Positive for congestion, rhinorrhea and sinus pain. Negative for sore throat.   Respiratory:  Positive for cough.   Neurological:  Positive for headaches.    Problems:  Patient Active Problem List   Diagnosis Date Noted   Chronic pain of left knee 01/06/2019   Left knee injury 07/23/2018   Hot flashes 08/01/2016   Depression, recurrent (HCC) 08/01/2016   Migraine 08/01/2016   Menopausal vasomotor syndrome 03/27/2016   Chronic nonseasonal allergic rhinitis due to pollen 03/27/2016   Abnormal skin of vulva 03/27/2016   Abnormal skin color 03/27/2016   Gastroesophageal reflux disease without esophagitis 03/27/2016   Fever blister 03/27/2016   Graves disease 02/10/2012    Allergies:  Allergies  Allergen Reactions   Latex Swelling   Povidone Iodine Rash   Medications:  Current Outpatient Medications:    acyclovir ointment (ZOVIRAX) 5 %, APPLY TO AFFECTED AREA EVERY NIGHT, Disp: 30 g, Rfl: 1   amoxicillin-clavulanate (AUGMENTIN) 875-125 MG tablet, Take 1 tablet by mouth 2 (two) times daily., Disp: 14 tablet, Rfl: 0   betamethasone dipropionate 0.05 % cream, APPLY TO AFFECTED AREA TWICE A DAY, Disp: 60 g, Rfl: 2   busPIRone (BUSPAR) 10 MG tablet, TAKE 1 TABLET BY MOUTH TWICE A DAY,  Disp: 180 tablet, Rfl: 0   carisoprodol (SOMA) 350 MG tablet, TAKE 1 TABLET BY MOUTH 4 TIMES A DAY AS NEEDED FOR MUSCLE SPASMS, Disp: 40 tablet, Rfl: 1   Chlorphen-PE-Acetaminophen 4-10-325 MG TABS, Take 1 tablet by mouth every 6 (six) hours as needed., Disp: 20 tablet, Rfl: 0   estradiol (ESTRACE) 1 MG tablet, TAKE 1 TABLET BY MOUTH EVERY DAY, Disp: 90 tablet, Rfl: 1   fluticasone (FLONASE) 50 MCG/ACT nasal spray, Place 2 sprays into both nostrils daily., Disp: 16 g, Rfl: 0   gabapentin (NEURONTIN) 100 MG capsule, Take 1 capsule (100 mg total) by mouth 3 (three) times daily., Disp: 90 capsule, Rfl: 1   guaiFENesin (MUCINEX) 600 MG 12 hr tablet, Take 1 tablet (600 mg total) by mouth 2 (two) times daily as needed for cough or to loosen phlegm., Disp: 20 tablet, Rfl: 0   medroxyPROGESTERone (PROVERA) 5 MG tablet, TAKE 1 TABLET (5 MG TOTAL) BY MOUTH DAILY., Disp: 90 tablet, Rfl: 1   topiramate (TOPAMAX) 50 MG tablet, Take 1 tablet (50 mg total) by mouth at bedtime as needed., Disp: 90 tablet, Rfl: 1   valACYclovir (VALTREX) 1000 MG tablet, Take 1 tablet (1,000 mg total) by mouth 3 (three) times daily., Disp: 270 tablet, Rfl: 1  Observations/Objective: Patient is well-developed, well-nourished in no acute distress.  Resting comfortably  at home.  Head is normocephalic, atraumatic.  No labored breathing.  Speech is clear and coherent with logical content.  Patient is alert and oriented at baseline.  Face flushed Raspy voice Dry cough Maxillary sinus pressure bil  Assessment and Plan:  Alexandria Ingram in today with chief complaint of URI   1. URI with cough and congestion 1. Take meds as prescribed 2. Use a cool mist humidifier especially during the winter months and when heat has been humid. 3. Use saline nose sprays frequently 4. Saline irrigations of the nose can be very helpful if done frequently.  * 4X daily for 1 week*  * Use of a nettie pot can be helpful with this. Follow  directions with this* 5. Drink plenty of fluids 6. Keep thermostat turn down low 7.For any cough or congestion- mucinex or delsym OTC 8. For fever or aces or pains- take tylenol or ibuprofen appropriate for age and weight.  * for fevers greater than 101 orally you may alternate ibuprofen and tylenol every  3 hours.    Meds ordered this encounter  Medications   amoxicillin-clavulanate (AUGMENTIN) 875-125 MG tablet    Sig: Take 1 tablet by mouth 2 (two) times daily.    Dispense:  14 tablet    Refill:  0    Order Specific Question:   Supervising Provider    Answer:   Arville Care A [[id number]]   predniSONE (DELTASONE) 20 MG tablet    Sig: Take 2 tablets (40 mg total) by mouth daily with breakfast for 5 days. 2 po daily for  5 days    Dispense:  10 tablet    Refill:  0    Order Specific Question:   Supervising Provider    Answer:   Arville Care A [[id number]]     Follow Up Instructions: I discussed the assessment and treatment plan with the patient. The patient was provided an opportunity to ask questions and all were answered. The patient agreed with the plan and demonstrated an understanding of the instructions.  A copy of instructions were sent to the patient via MyChart.  The patient was advised to call back or seek an in-person evaluation if the symptoms worsen or if the condition fails to improve as anticipated.  Time:  I spent 6 minutes with the patient via telehealth technology discussing the above problems/concerns.    Mary-Margaret Daphine Deutscher, FNP

## 2023-04-23 ENCOUNTER — Encounter: Payer: Self-pay | Admitting: Nurse Practitioner

## 2023-04-23 ENCOUNTER — Telehealth (INDEPENDENT_AMBULATORY_CARE_PROVIDER_SITE_OTHER): Payer: 59 | Admitting: Nurse Practitioner

## 2023-04-23 DIAGNOSIS — F419 Anxiety disorder, unspecified: Secondary | ICD-10-CM | POA: Diagnosis not present

## 2023-04-23 NOTE — Patient Instructions (Signed)

## 2023-04-23 NOTE — Progress Notes (Signed)
Virtual Visit Consent   Alexandria Ingram, you are scheduled for a virtual visit with Mary-Margaret Daphine Deutscher, FNP, a [facility] provider, today.     Just as with appointments in the office, your consent must be obtained to participate.  Your consent will be active for this visit and any virtual visit you may have with one of our providers in the next 365 days.     If you have a MyChart account, a copy of this consent can be sent to you electronically.  All virtual visits are billed to your insurance company just like a traditional visit in the office.    As this is a virtual visit, video technology does not allow for your provider to perform a traditional examination.  This may limit your provider's ability to fully assess your condition.  If your provider identifies any concerns that need to be evaluated in person or the need to arrange testing (such as labs, EKG, etc.), we will make arrangements to do so.     Although advances in technology are sophisticated, we cannot ensure that it will always work on either your end or our end.  If the connection with a video visit is poor, the visit may have to be switched to a telephone visit.  With either a video or telephone visit, we are not always able to ensure that we have a secure connection.     I need to obtain your verbal consent now.   Are you willing to proceed with your visit today? YES   Alexandria Ingram has provided verbal consent on 04/23/2023 for a virtual visit (video or telephone).   Mary-Margaret Daphine Deutscher, FNP   Date: 04/23/2023 12:03 PM   Virtual Visit via Video Note   I, Mary-Margaret Daphine Deutscher, connected with Alexandria Ingram ([id number], [birth year]) on 04/23/23 at 12:15 PM EST by a video-enabled telemedicine application and verified that I am speaking with the correct person using two identifiers.  Location: Patient: patient is at work. Provider: Virtual Visit Location Provider: Mobile   I discussed the limitations of evaluation and  management by telemedicine and the availability of in person appointments. The patient expressed understanding and agreed to proceed.    History of Present Illness: Alexandria Ingram is a 62 y.o. who identifies as a female who was assigned female at birth, and is being seen today for anxiety and panic attacks.  HPI: Patient has been on panic mode since Monday. She says her skin feels crawly and she is SOB and tearful. 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     Review of Systems  Constitutional:  Negative for diaphoresis and weight loss.  Eyes:  Negative for blurred vision, double vision and pain.  Respiratory:  Negative for shortness of breath.   Cardiovascular:  Negative for chest pain, palpitations, orthopnea and leg swelling.  Gastrointestinal:  Negative for abdominal pain.  Skin:  Negative for rash.  Neurological:  Negative for dizziness, sensory change, loss of consciousness, weakness and headaches.  Endo/Heme/Allergies:  Negative for polydipsia. Does not bruise/bleed easily.  Psychiatric/Behavioral:  Negative for memory loss. The patient does not have insomnia.   All other systems reviewed and are negative.   Problems:  Patient Active Problem List   Diagnosis Date Noted   Chronic pain of left knee  01/06/2019   Left knee injury 07/23/2018   Hot flashes 08/01/2016   Depression, recurrent (HCC) 08/01/2016   Migraine 08/01/2016   Menopausal vasomotor syndrome 03/27/2016   Chronic nonseasonal allergic rhinitis due to pollen 03/27/2016   Abnormal skin of vulva 03/27/2016   Abnormal skin color 03/27/2016   Gastroesophageal reflux disease without esophagitis 03/27/2016   Fever blister 03/27/2016   Graves disease 02/10/2012    Allergies:  Allergies  Allergen Reactions   Latex Swelling    Povidone Iodine Rash   Medications:  Current Outpatient Medications:    acyclovir ointment (ZOVIRAX) 5 %, APPLY TO AFFECTED AREA EVERY NIGHT, Disp: 30 g, Rfl: 1   amoxicillin-clavulanate (AUGMENTIN) 875-125 MG tablet, Take 1 tablet by mouth 2 (two) times daily., Disp: 14 tablet, Rfl: 0   betamethasone dipropionate 0.05 % cream, APPLY TO AFFECTED AREA TWICE A DAY, Disp: 60 g, Rfl: 2   busPIRone (BUSPAR) 10 MG tablet, TAKE 1 TABLET BY MOUTH TWICE A DAY, Disp: 180 tablet, Rfl: 0   carisoprodol (SOMA) 350 MG tablet, TAKE 1 TABLET BY MOUTH 4 TIMES A DAY AS NEEDED FOR MUSCLE SPASMS, Disp: 40 tablet, Rfl: 1   Chlorphen-PE-Acetaminophen 4-10-325 MG TABS, Take 1 tablet by mouth every 6 (six) hours as needed., Disp: 20 tablet, Rfl: 0   estradiol (ESTRACE) 1 MG tablet, TAKE 1 TABLET BY MOUTH EVERY DAY, Disp: 90 tablet, Rfl: 1   fluticasone (FLONASE) 50 MCG/ACT nasal spray, Place 2 sprays into both nostrils daily., Disp: 16 g, Rfl: 0   gabapentin (NEURONTIN) 100 MG capsule, Take 1 capsule (100 mg total) by mouth 3 (three) times daily., Disp: 90 capsule, Rfl: 1   guaiFENesin (MUCINEX) 600 MG 12 hr tablet, Take 1 tablet (600 mg total) by mouth 2 (two) times daily as needed for cough or to loosen phlegm., Disp: 20 tablet, Rfl: 0   medroxyPROGESTERone (PROVERA) 5 MG tablet, TAKE 1 TABLET (5 MG TOTAL) BY MOUTH DAILY., Disp: 90 tablet, Rfl: 1   topiramate (TOPAMAX) 50 MG tablet, Take 1 tablet (50 mg total) by mouth at bedtime as needed., Disp: 90 tablet, Rfl: 1   valACYclovir (VALTREX) 1000 MG tablet, Take 1 tablet (1,000 mg total) by mouth 3 (three) times daily., Disp: 270 tablet, Rfl: 1  Observations/Objective: Patient is well-developed, well-nourished in no acute distress.  Resting comfortably  at home.  Head is normocephalic, traumatic.  No labored breathing.  Speech is clear and coherent with logical content.  Patient is alert and oriented at baseline.  Very talkative Tearful during visit  Assessment  and Plan:  Alexandria Ingram in today with chief complaint of anxiety and panic attacks   1. Anxiety Stress management Continue all meds Note for work given   Follow Up Instructions: I discussed the assessment and treatment plan with the patient. The patient was provided an opportunity to ask questions and all were answered. The patient agreed with the plan and demonstrated an understanding of the instructions.  A copy of instructions were sent to the patient via MyChart.  The patient was advised to call back or seek an in-person evaluation if the symptoms worsen or if the condition fails to improve as anticipated.  Time:  I spent 14 minutes with the patient via telehealth technology discussing the above problems/concerns.    Mary-Margaret Daphine Deutscher, FNP

## 2023-05-07 NOTE — Telephone Encounter (Signed)
Ok to write note for patient

## 2023-05-19 ENCOUNTER — Other Ambulatory Visit: Payer: Self-pay | Admitting: Nurse Practitioner

## 2023-05-22 ENCOUNTER — Other Ambulatory Visit: Payer: Self-pay | Admitting: Nurse Practitioner

## 2023-05-22 NOTE — Telephone Encounter (Signed)
MMM pt NTBS 30-d given 05/20/23

## 2023-05-23 ENCOUNTER — Encounter: Payer: Self-pay | Admitting: Nurse Practitioner

## 2023-05-23 NOTE — Telephone Encounter (Signed)
LMTCB to schedule appt Letter mailed 

## 2023-05-24 ENCOUNTER — Telehealth: Payer: Self-pay

## 2023-05-24 NOTE — Telephone Encounter (Signed)
Copied from CRM [id number]. Topic: Clinical - Prescription Issue >> May 24, 2023  1:25 PM Fuller Mandril wrote: Reason for CRM: Pt states that she received call that she needed to make an appt in order to get a refill for estradiol (ESTRACE) 1 MG tablet. Pt states this is on going problem with [facility] she has asked to be taken off of [facility]. She did not request refill and is aware she needs appt. She has a month supply and will make an appt before she is out and when she is ready to request refill. She asked [facility] to put medication back. Thank You

## 2023-06-20 ENCOUNTER — Other Ambulatory Visit: Payer: Self-pay | Admitting: Nurse Practitioner

## 2023-07-08 ENCOUNTER — Ambulatory Visit: Payer: Self-pay | Admitting: Nurse Practitioner

## 2023-07-08 NOTE — Telephone Encounter (Signed)
Copied from CRM [id number]. Topic: Clinical - Red Word Triage >> Jul 08, 2023  8:33 AM Gildardo Pounds wrote: Red Word that prompted transfer to Nurse Triage: short of breath, feels like someone sitting on her chest, fever, and flu-like symptoms, yellow phlegm, headache covid test negative, [phone]   Chief Complaint: Flu-like symptoms Symptoms: productive cough, chest pain only when coughing, congestion, headache Disposition: [] ED /[] Urgent Care (no appt availability in office) / [x] Appointment(In office/virtual)/ []  [facility]/ [] Home Care/ [] Refused Recommended Disposition /[] [facility]/ []  Follow-up with PCP Additional Notes: Patient stated she is having flu-like symptoms since Thursday. She has been trying OTC remedies and does not notice any improvement. Patient declining to come into the office. Virtual appointment scheduled for tomorrow.   Reason for Disposition  [1] Using nasal washes and pain medicine > 24 hours AND [2] sinus pain (around cheekbone or eye) persists  Answer Assessment - Initial Assessment Questions 1. ONSET: "When did the cough begin?"      Thursday  2. SEVERITY: "How bad is the cough today?"      Sporadic cough  3. SPUTUM: "Describe the color of your sputum" (none, dry cough; clear, white, yellow, green)     Yellow phlegm  4. HEMOPTYSIS: "Are you coughing up any blood?" If so ask: "How much?" (flecks, streaks, tablespoons, etc.)     N/A  5. DIFFICULTY BREATHING: "Are you having difficulty breathing?" If Yes, ask: "How bad is it?" (e.g., mild, moderate, severe)    - MILD: No SOB at rest, mild SOB with walking, speaks normally in sentences, can lie down, no retractions, pulse < 100.    - MODERATE: SOB at rest, SOB with minimal exertion and prefers to sit, cannot lie down flat, speaks in phrases, mild retractions, audible wheezing, pulse 100-120.    - SEVERE: Very SOB at rest, speaks in single words, struggling to breathe, sitting hunched  forward, retractions, pulse > 120      Wheezing, some SOB with congestion but not struggling to breathe  6. FEVER: "Do you have a fever?" If Yes, ask: "What is your temperature, how was it measured, and when did it start?"     Felt feverish while sleeping, woke up drenched. Does not feel feverish at this moment  7. OTHER SYMPTOMS: "Do you have any other symptoms?" (e.g., runny nose, wheezing, chest pain)       Headache, chest pain when coughing, ear congestion on right side  Protocols used: Cough - Acute Productive-A-AH

## 2023-07-09 ENCOUNTER — Telehealth: Payer: 59 | Admitting: Nurse Practitioner

## 2023-07-09 ENCOUNTER — Encounter: Payer: Self-pay | Admitting: Nurse Practitioner

## 2023-07-09 DIAGNOSIS — J111 Influenza due to unidentified influenza virus with other respiratory manifestations: Secondary | ICD-10-CM

## 2023-07-09 MED ORDER — PROMETHAZINE-DM 6.25-15 MG/5ML PO SYRP: 5.0000 mL | ORAL_SOLUTION | Freq: Four times a day (QID) | ORAL | 0 refills | Status: DC | PRN

## 2023-07-09 NOTE — Progress Notes (Signed)
 Virtual Visit Consent   Alexandria Ingram, you are scheduled for a virtual visit with Mary-Margaret Gladis, FNP, a [facility] provider, today.     Just as with appointments in the office, your consent must be obtained to participate.  Your consent will be active for this visit and any virtual visit you may have with one of our providers in the next 365 days.     If you have a MyChart account, a copy of this consent can be sent to you electronically.  All virtual visits are billed to your insurance company just like a traditional visit in the office.    As this is a virtual visit, video technology does not allow for your provider to perform a traditional examination.  This may limit your provider's ability to fully assess your condition.  If your provider identifies any concerns that need to be evaluated in person or the need to arrange testing (such as labs, EKG, etc.), we will make arrangements to do so.     Although advances in technology are sophisticated, we cannot ensure that it will always work on either your end or our end.  If the connection with a video visit is poor, the visit may have to be switched to a telephone visit.  With either a video or telephone visit, we are not always able to ensure that we have a secure connection.     I need to obtain your verbal consent now.   Are you willing to proceed with your visit today? YES   ITZABELLA SORRELS has provided verbal consent on 07/09/2023 for a virtual visit (video or telephone).   Mary-Margaret Gladis, FNP   Date: 07/09/2023 8:10 AM   Virtual Visit via Video Note   I, Mary-Margaret Gladis, connected with Alexandria Ingram ([id number], Dec 06, 1960) on 07/09/23 at  8:05 AM EST by a video-enabled telemedicine application and verified that I am speaking with the correct person using two identifiers.  Location: Patient: Virtual Visit Location Patient: Home Provider: Virtual Visit Location Provider: Mobile   I discussed the limitations of  evaluation and management by telemedicine and the availability of in person appointments. The patient expressed understanding and agreed to proceed.    History of Present Illness: Alexandria Ingram is a 63 y.o. who identifies as a female who was assigned female at birth, and is being seen today for influenza.  HPI: Influenza This is a new problem. The current episode started in the past 7 days. Associated symptoms include chills, congestion, coughing, fatigue and myalgias. Pertinent negatives include no sore throat. Nothing aggravates the symptoms. Treatments tried: mucinex . The treatment provided mild relief.    Review of Systems  Constitutional:  Positive for chills and fatigue.  HENT:  Positive for congestion. Negative for sore throat.   Respiratory:  Positive for cough.   Musculoskeletal:  Positive for myalgias.    Problems:  Patient Active Problem List   Diagnosis Date Noted   Chronic pain of left knee 01/06/2019   Left knee injury 07/23/2018   Hot flashes 08/01/2016   Depression, recurrent (HCC) 08/01/2016   Migraine 08/01/2016   Menopausal vasomotor syndrome 03/27/2016   Chronic nonseasonal allergic rhinitis due to pollen 03/27/2016   Abnormal skin of vulva 03/27/2016   Abnormal skin color 03/27/2016   Gastroesophageal reflux disease without esophagitis 03/27/2016   Fever blister 03/27/2016   Graves disease 02/10/2012    Allergies:  Allergies  Allergen Reactions   Latex Swelling   Povidone  Iodine Rash   Medications:  Current Outpatient Medications:    acyclovir  ointment (ZOVIRAX ) 5 %, APPLY TO AFFECTED AREA EVERY NIGHT, Disp: 30 g, Rfl: 1   amoxicillin -clavulanate (AUGMENTIN ) 875-125 MG tablet, Take 1 tablet by mouth 2 (two) times daily., Disp: 14 tablet, Rfl: 0   betamethasone  dipropionate 0.05 % cream, APPLY TO AFFECTED AREA TWICE A DAY, Disp: 60 g, Rfl: 0   busPIRone  (BUSPAR ) 10 MG tablet, TAKE 1 TABLET BY MOUTH TWICE A DAY, Disp: 180 tablet, Rfl: 0   carisoprodol   (SOMA ) 350 MG tablet, TAKE 1 TABLET BY MOUTH 4 TIMES A DAY AS NEEDED FOR MUSCLE SPASMS, Disp: 40 tablet, Rfl: 1   Chlorphen-PE-Acetaminophen  4-10-325 MG TABS, Take 1 tablet by mouth every 6 (six) hours as needed., Disp: 20 tablet, Rfl: 0   estradiol  (ESTRACE ) 1 MG tablet, Take 1 tablet (1 mg total) by mouth daily. **NEEDS TO BE SEEN BEFORE NEXT REFILL**, Disp: 30 tablet, Rfl: 0   fluticasone  (FLONASE ) 50 MCG/ACT nasal spray, Place 2 sprays into both nostrils daily., Disp: 16 g, Rfl: 0   gabapentin  (NEURONTIN ) 100 MG capsule, Take 1 capsule (100 mg total) by mouth 3 (three) times daily., Disp: 90 capsule, Rfl: 1   guaiFENesin  (MUCINEX ) 600 MG 12 hr tablet, Take 1 tablet (600 mg total) by mouth 2 (two) times daily as needed for cough or to loosen phlegm., Disp: 20 tablet, Rfl: 0   medroxyPROGESTERone  (PROVERA ) 5 MG tablet, TAKE 1 TABLET (5 MG TOTAL) BY MOUTH DAILY., Disp: 90 tablet, Rfl: 1   topiramate  (TOPAMAX ) 50 MG tablet, Take 1 tablet (50 mg total) by mouth at bedtime as needed., Disp: 90 tablet, Rfl: 1   valACYclovir  (VALTREX ) 1000 MG tablet, Take 1 tablet (1,000 mg total) by mouth 3 (three) times daily., Disp: 270 tablet, Rfl: 1  Observations/Objective: Patient is well-developed, well-nourished in no acute distress.  Resting comfortably  at home.  Head is normocephalic, atraumatic.  No labored breathing.  Speech is clear and coherent with logical content.  Patient is alert and oriented at baseline.  Deep wet cough  Assessment and Plan:  Alexandria Ingram in today with chief complaint of Influenza   1. Influenza (Primary) 1. Take meds as prescribed 2. Use a cool mist humidifier especially during the winter months and when heat has been humid. 3. Use saline nose sprays frequently 4. Saline irrigations of the nose can be very helpful if done frequently.  * 4X daily for 1 week*  * Use of a nettie pot can be helpful with this. Follow directions with this* 5. Drink plenty of fluids 6.  Keep thermostat turn down low 7.For any cough or congestion- promethazine  DM 8. For fever or aces or pains- take tylenol  or ibuprofen appropriate for age and weight.  * for fevers greater than 101 orally you may alternate ibuprofen and tylenol  every  3 hours.    Meds ordered this encounter  Medications   promethazine -dextromethorphan (PROMETHAZINE -DM) 6.25-15 MG/5ML syrup    Sig: Take 5 mLs by mouth 4 (four) times daily as needed.    Dispense:  118 mL    Refill:  0    Supervising Provider:   MARYANNE CHEW A [[id number]]       Follow Up Instructions: I discussed the assessment and treatment plan with the patient. The patient was provided an opportunity to ask questions and all were answered. The patient agreed with the plan and demonstrated an understanding of the instructions.  A copy of instructions  were sent to the patient via MyChart.  The patient was advised to call back or seek an in-person evaluation if the symptoms worsen or if the condition fails to improve as anticipated.  Time:  I spent 10 minutes with the patient via telehealth technology discussing the above problems/concerns.    Mary-Margaret Gladis, FNP

## 2023-07-09 NOTE — Patient Instructions (Signed)

## 2023-07-11 NOTE — Telephone Encounter (Signed)
 [place] for new work note?

## 2023-07-18 ENCOUNTER — Ambulatory Visit: Payer: 59 | Admitting: Nurse Practitioner

## 2023-07-18 ENCOUNTER — Other Ambulatory Visit: Payer: Self-pay | Admitting: Nurse Practitioner

## 2023-07-26 ENCOUNTER — Ambulatory Visit: Payer: 59 | Admitting: Nurse Practitioner

## 2023-08-09 ENCOUNTER — Telehealth: Payer: Self-pay | Admitting: Nurse Practitioner

## 2023-08-09 ENCOUNTER — Ambulatory Visit: Payer: 59 | Admitting: Nurse Practitioner

## 2023-08-09 VITALS — BP 134/90 | HR 88 | Temp 97.5°F | Ht 66.0 in | Wt 141.0 lb

## 2023-08-09 DIAGNOSIS — Z0001 Encounter for general adult medical examination with abnormal findings: Secondary | ICD-10-CM | POA: Diagnosis not present

## 2023-08-09 DIAGNOSIS — G43809 Other migraine, not intractable, without status migrainosus: Secondary | ICD-10-CM

## 2023-08-09 DIAGNOSIS — F339 Major depressive disorder, recurrent, unspecified: Secondary | ICD-10-CM

## 2023-08-09 DIAGNOSIS — K219 Gastro-esophageal reflux disease without esophagitis: Secondary | ICD-10-CM | POA: Diagnosis not present

## 2023-08-09 DIAGNOSIS — B001 Herpesviral vesicular dermatitis: Secondary | ICD-10-CM | POA: Diagnosis not present

## 2023-08-09 DIAGNOSIS — Z0000 Encounter for general adult medical examination without abnormal findings: Secondary | ICD-10-CM

## 2023-08-09 DIAGNOSIS — N951 Menopausal and female climacteric states: Secondary | ICD-10-CM

## 2023-08-09 DIAGNOSIS — J3089 Other allergic rhinitis: Secondary | ICD-10-CM

## 2023-08-09 MED ORDER — ESTRADIOL 1 MG PO TABS: 1.0000 mg | ORAL_TABLET | Freq: Every day | ORAL | 1 refills | Status: DC

## 2023-08-09 MED ORDER — TOPIRAMATE 50 MG PO TABS: 50.0000 mg | ORAL_TABLET | Freq: Every evening | ORAL | 1 refills | Status: AC | PRN

## 2023-08-09 MED ORDER — BUSPIRONE HCL 10 MG PO TABS: 10.0000 mg | ORAL_TABLET | Freq: Two times a day (BID) | ORAL | 1 refills | Status: AC

## 2023-08-09 MED ORDER — VALACYCLOVIR HCL 1 G PO TABS: 1000.0000 mg | ORAL_TABLET | Freq: Three times a day (TID) | ORAL | 1 refills | Status: AC

## 2023-08-09 NOTE — Progress Notes (Signed)
 Subjective:    Patient ID: Alexandria Ingram, female    DOB: [birth year], 63 y.o.   MRN: [id number]   Chief Complaint: annual physical    HPI:  Alexandria Ingram is a 63 y.o. who identifies as a female who was assigned female at birth.   Social history: Lives with:  Work history: works for [employer] in today for follow up of the following chronic medical issues:  1. Gastroesophageal reflux disease without esophagitis Is on no prescription meds. Uses OTC when needed.  2. Graves disease No problems that she is aware of.   3. Depression, recurrent (HCC) Is on celexa- has had some grief issues lately and has been out of work some. She also has buspar to take and ususlaly takes daily.she is still grieving over the death of her husband and her son in the last year    07/24/2022    2:49 PM 01/16/2022    9:56 AM 09/20/2021    1:51 PM  Depression screen PHQ 2/9  Decreased Interest 2 2 2   Down, Depressed, Hopeless 0 2 2  PHQ - 2 Score 2 4 4   Altered sleeping 0 2 1  Tired, decreased energy 1 2 1   Change in appetite 1 2 3   Feeling bad or failure about yourself  0 0 0  Trouble concentrating 0 0 2  Moving slowly or fidgety/restless 0 0 0  Suicidal thoughts 0 0 0  PHQ-9 Score 4 10 11   Difficult doing work/chores Not difficult at all Somewhat difficult       4. Other migraine without status migrainosus, not intractable Is on topamax and still has occasional migraines.   5. Chronic nonseasonal allergic rhinitis due to pollen Use OTC meds  6.  Herpes zoster Is on valtrex daily. Takes nerontin when she has flare up to help with pain.  7. Menoausal symptoms Estradiol daily    New complaints: none  Allergies  Allergen Reactions   Latex Swelling   Povidone Iodine Rash   Outpatient Encounter Medications as of 08/09/2023  Medication Sig   acyclovir ointment (ZOVIRAX) 5 % APPLY TO AFFECTED AREA EVERY NIGHT   amoxicillin-clavulanate (AUGMENTIN) 875-125 MG tablet Take  1 tablet by mouth 2 (two) times daily.   betamethasone dipropionate 0.05 % cream APPLY TO AFFECTED AREA TWICE A DAY   busPIRone (BUSPAR) 10 MG tablet TAKE 1 TABLET BY MOUTH TWICE A DAY   carisoprodol (SOMA) 350 MG tablet TAKE 1 TABLET BY MOUTH 4 TIMES A DAY AS NEEDED FOR MUSCLE SPASMS   Chlorphen-PE-Acetaminophen 4-10-325 MG TABS Take 1 tablet by mouth every 6 (six) hours as needed.   estradiol (ESTRACE) 1 MG tablet Take 1 tablet (1 mg total) by mouth daily. **NEEDS TO BE SEEN BEFORE NEXT REFILL**   fluticasone (FLONASE) 50 MCG/ACT nasal spray Place 2 sprays into both nostrils daily.   gabapentin (NEURONTIN) 100 MG capsule Take 1 capsule (100 mg total) by mouth 3 (three) times daily.   guaiFENesin (MUCINEX) 600 MG 12 hr tablet Take 1 tablet (600 mg total) by mouth 2 (two) times daily as needed for cough or to loosen phlegm.   medroxyPROGESTERone (PROVERA) 5 MG tablet TAKE 1 TABLET (5 MG TOTAL) BY MOUTH DAILY.   promethazine-dextromethorphan (PROMETHAZINE-DM) 6.25-15 MG/5ML syrup Take 5 mLs by mouth 4 (four) times daily as needed.   topiramate (TOPAMAX) 50 MG tablet Take 1 tablet (50 mg total) by mouth at bedtime as needed.   valACYclovir (VALTREX) 1000  MG tablet Take 1 tablet (1,000 mg total) by mouth 3 (three) times daily.   No facility-administered encounter medications on file as of 08/09/2023.    No past surgical history on file.  No family history on file.    Controlled substance contract: n/a     Review of Systems  Constitutional:  Negative for diaphoresis.  Eyes:  Negative for pain.  Respiratory:  Negative for shortness of breath.   Cardiovascular:  Negative for chest pain, palpitations and leg swelling.  Gastrointestinal:  Negative for abdominal pain.  Endocrine: Negative for polydipsia.  Skin:  Negative for rash.  Neurological:  Negative for dizziness, weakness and headaches.  Hematological:  Does not bruise/bleed easily.  All other systems reviewed and are  negative.      Objective:   Physical Exam Vitals and nursing note reviewed.  Constitutional:      General: She is not in acute distress.    Appearance: Normal appearance. She is well-developed.  HENT:     Head: Normocephalic.     Right Ear: Tympanic membrane normal.     Left Ear: Tympanic membrane normal.     Nose: Nose normal.     Mouth/Throat:     Mouth: Mucous membranes are moist.  Eyes:     Pupils: Pupils are equal, round, and reactive to light.  Neck:     Vascular: No carotid bruit or JVD.  Cardiovascular:     Rate and Rhythm: Normal rate and regular rhythm.     Heart sounds: Normal heart sounds.  Pulmonary:     Effort: Pulmonary effort is normal. No respiratory distress.     Breath sounds: Normal breath sounds. No wheezing or rales.  Chest:     Chest wall: No tenderness.  Abdominal:     General: Bowel sounds are normal. There is no distension or abdominal bruit.     Palpations: Abdomen is soft. There is no hepatomegaly, splenomegaly, mass or pulsatile mass.     Tenderness: There is no abdominal tenderness.  Musculoskeletal:        General: Normal range of motion.     Cervical back: Normal range of motion and neck supple.  Lymphadenopathy:     Cervical: No cervical adenopathy.  Skin:    General: Skin is warm and dry.  Neurological:     Mental Status: She is alert and oriented to person, place, and time.     Deep Tendon Reflexes: Reflexes are normal and symmetric.  Psychiatric:        Behavior: Behavior normal.        Thought Content: Thought content normal.        Judgment: Judgment normal.     LMP 04/02/2015   BP (!) 134/90   Pulse 88   Temp (!) 97.5 F (36.4 C) (Temporal)   Ht 5\' 6"  (1.676 m)   Wt 141 lb (64 kg)   LMP 04/02/2015   SpO2 100%   BMI 22.76 kg/m       Assessment & Plan:   Alexandria Ingram comes in today with chief complaint of No chief complaint on file.   Diagnosis and orders addressed:  1. Gastroesophageal reflux disease  without esophagitis Avoid spicy foods Do not eat 2 hours prior to bedtime   2. Graves disease Labs pending - Thyroid Panel With TSH  3. Depression, recurrent (HCC) Stress management  4. Other migraine without status migrainosus, not intractable Avoid caffeine - CBC with Differential/Platelet - CMP14+EGFR - Lipid panel  5. Chronic nonseasonal allergic rhinitis due to pollen Continue Flonase Add claritin OTC  6. Fever blister - valACYclovir (VALTREX) 1000 MG tablet; Take 1 tablet (1,000 mg total) by mouth 3 (three) times daily.  Dispense: 270 tablet; Refill: 1  7. Herpes zoster without complication - gabapentin (NEURONTIN) 100 MG capsule; Take 1 capsule (100 mg total) by mouth 3 (three) times daily.  Dispense: 90 capsule; Refill: 3   Labs pending Health Maintenance reviewed Diet and exercise encouraged  Follow up plan: 6 months   Mary-Margaret Daphine Deutscher, FNP

## 2023-08-09 NOTE — Patient Instructions (Signed)
 Exercising to Stay Healthy To become healthy and stay healthy, it is recommended that you do moderate-intensity and vigorous-intensity exercise. You can tell that you are exercising at a moderate intensity if your heart starts beating faster and you start breathing faster but can still hold a conversation. You can tell that you are exercising at a vigorous intensity if you are breathing much harder and faster and cannot hold a conversation while exercising. How can exercise benefit me? Exercising regularly is important. It has many health benefits, such as: Improving overall fitness, flexibility, and endurance. Increasing bone density. Helping with weight control. Decreasing body fat. Increasing muscle strength and endurance. Reducing stress and tension, anxiety, depression, or anger. Improving overall health. What guidelines should I follow while exercising? Before you start a new exercise program, talk with your health care provider. Do not exercise so much that you hurt yourself, feel dizzy, or get very short of breath. Wear comfortable clothes and wear shoes with good support. Drink plenty of water while you exercise to prevent dehydration or heat stroke. Work out until your breathing and your heartbeat get faster (moderate intensity). How often should I exercise? Choose an activity that you enjoy, and set realistic goals. Your health care provider can help you make an activity plan that is individually designed and works best for you. Exercise regularly as told by your health care provider. This may include: Doing strength training two times a week, such as: Lifting weights. Using resistance bands. Push-ups. Sit-ups. Yoga. Doing a certain intensity of exercise for a given amount of time. Choose from these options: A total of 150 minutes of moderate-intensity exercise every week. A total of 75 minutes of vigorous-intensity exercise every week. A mix of moderate-intensity and  vigorous-intensity exercise every week. Children, pregnant women, people who have not exercised regularly, people who are overweight, and older adults may need to talk with a health care provider about what activities are safe to perform. If you have a medical condition, be sure to talk with your health care provider before you start a new exercise program. What are some exercise ideas? Moderate-intensity exercise ideas include: Walking 1 mile (1.6 km) in about 15 minutes. Biking. Hiking. Golfing. Dancing. Water aerobics. Vigorous-intensity exercise ideas include: Walking 4.5 miles (7.2 km) or more in about 1 hour. Jogging or running 5 miles (8 km) in about 1 hour. Biking 10 miles (16.1 km) or more in about 1 hour. Lap swimming. Roller-skating or in-line skating. Cross-country skiing. Vigorous competitive sports, such as football, basketball, and soccer. Jumping rope. Aerobic dancing. What are some everyday activities that can help me get exercise? Yard work, such as: Child psychotherapist. Raking and bagging leaves. Washing your car. Pushing a stroller. Shoveling snow. Gardening. Washing windows or floors. How can I be more active in my day-to-day activities? Use stairs instead of an elevator. Take a walk during your lunch break. If you drive, park your car farther away from your work or school. If you take public transportation, get off one stop early and walk the rest of the way. Stand up or walk around during all of your indoor phone calls. Get up, stretch, and walk around every 30 minutes throughout the day. Enjoy exercise with a friend. Support to continue exercising will help you keep a regular routine of activity. Where to find more information You can find more information about exercising to stay healthy from: U.[place]. Department of Health and Human Services: ThisPath.fi Centers for Disease Control and Prevention (  CDC): [employer]Ex[employer].com.br Summary Exercising regularly is  important. It will improve your overall fitness, flexibility, and endurance. Regular exercise will also improve your overall health. It can help you control your weight, reduce stress, and improve your bone density. Do not exercise so much that you hurt yourself, feel dizzy, or get very short of breath. Before you start a new exercise program, talk with your health care provider. This information is not intended to replace advice given to you by your health care provider. Make sure you discuss any questions you have with your health care provider. Document Revised: 09/16/2020 Document Reviewed: 09/16/2020 Elsevier Patient Education  2024 [employer].

## 2023-08-09 NOTE — Telephone Encounter (Signed)
 Received FMLA paperwork for patient (2 different forms) one for herself for migraines and the other is to help her mother who is in need.  Forms are paid form. Will put forms in [employer] box.

## 2023-08-13 NOTE — Telephone Encounter (Signed)
 PCP completed and signed FMLA forms. They have been faxed to [employer] at fax number [phone]. Patient has been contacted and informed they are complete.

## 2023-08-24 ENCOUNTER — Other Ambulatory Visit: Payer: Self-pay | Admitting: Nurse Practitioner

## 2023-08-24 DIAGNOSIS — M545 Low back pain, unspecified: Secondary | ICD-10-CM

## 2023-10-09 IMAGING — MG MM DIGITAL SCREENING BILAT W/ TOMO AND CAD
8 series · 8 of 24 positions shown · non-contrast
Comparison: Previous exam(s).

CLINICAL DATA: Screening.

EXAM:
DIGITAL SCREENING BILATERAL MAMMOGRAM WITH TOMOSYNTHESIS AND CAD
TECHNIQUE: Bilateral screening digital craniocaudal and mediolateral oblique
mammograms were obtained. Bilateral screening digital breast
tomosynthesis was performed. The images were evaluated with
computer-aided detection.

[R CC synth-2D]
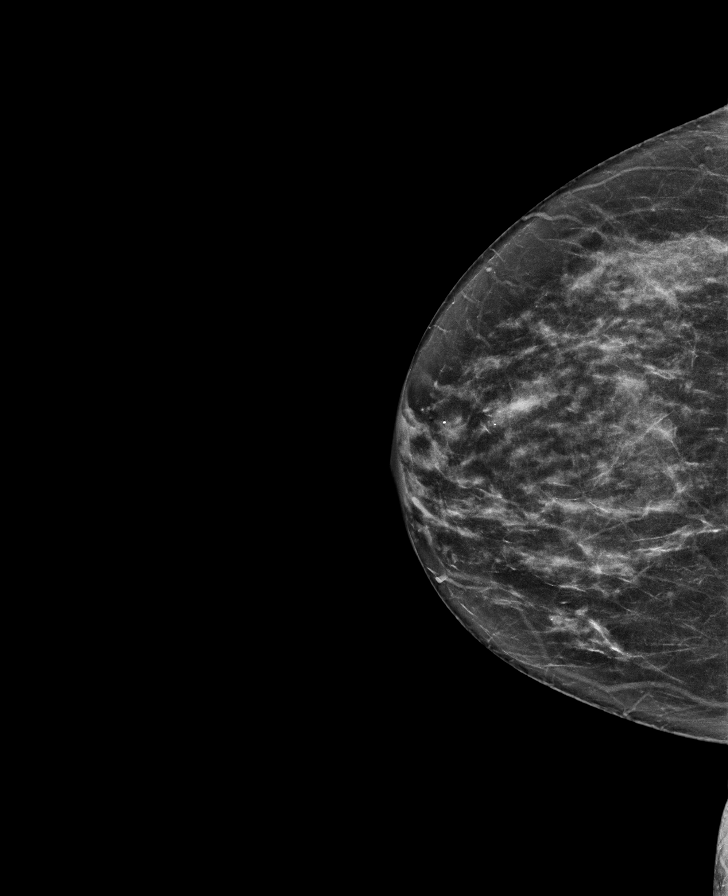

[L MLO synth-2D]
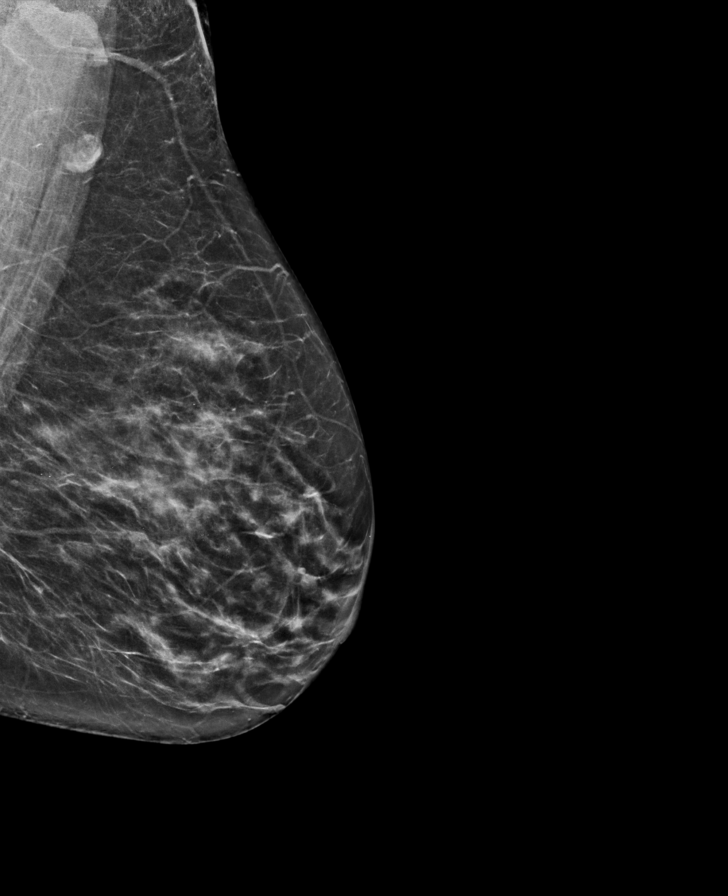

[L CC synth-2D]
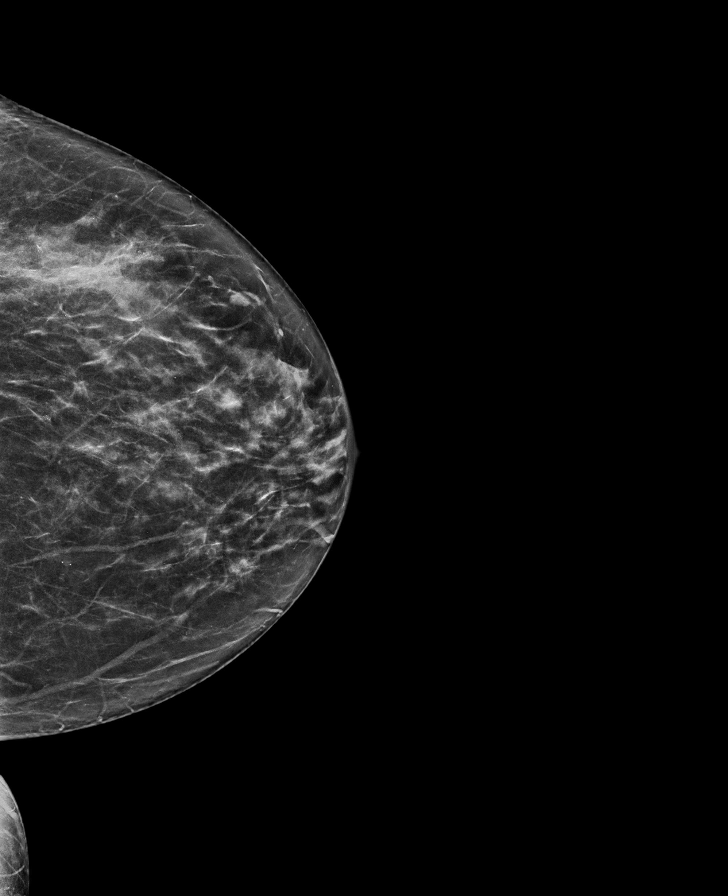

[R MLO synth-2D]
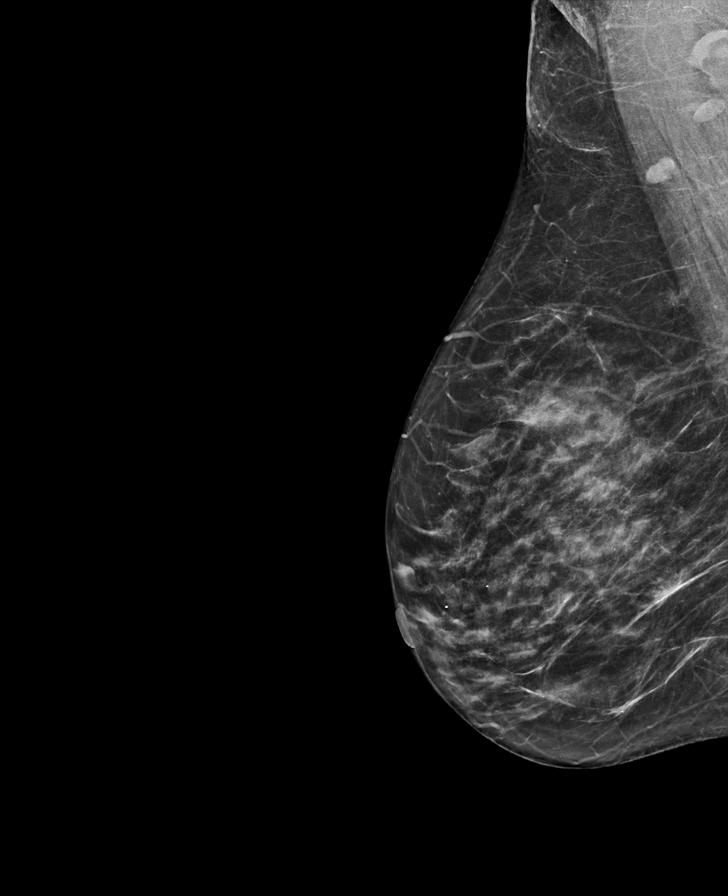

[L MLO tomo · tomo slice 35/68.0]
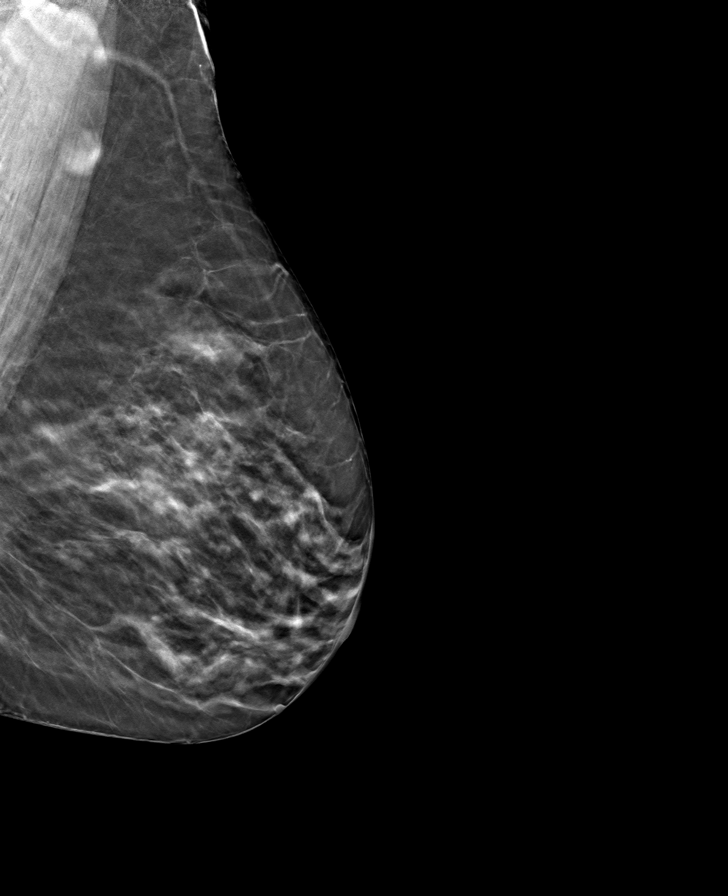

[R CC tomo · tomo slice 33/64.0]
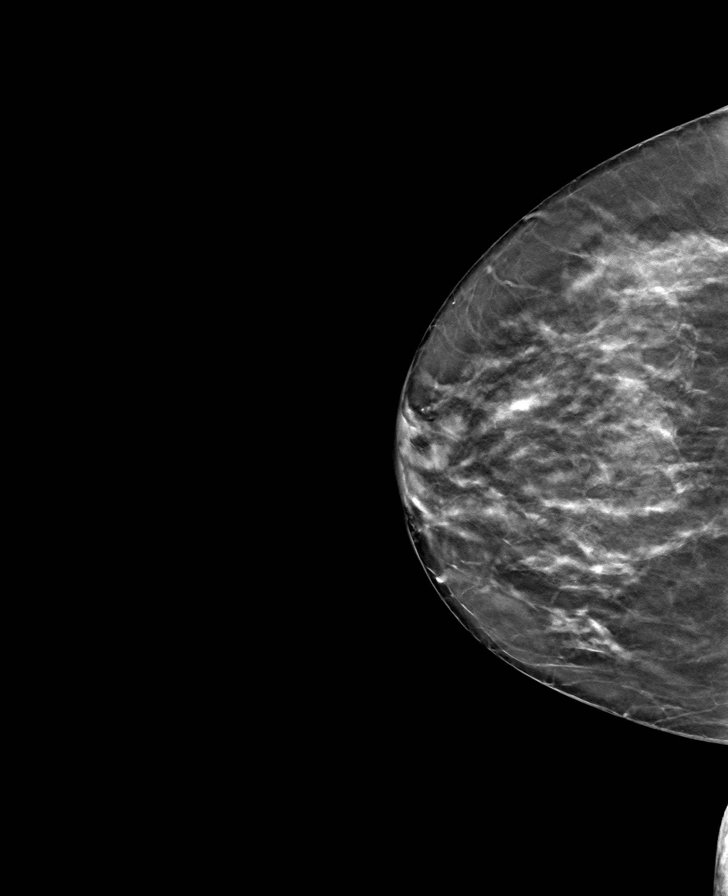

[L CC tomo · tomo slice 33/64.0]
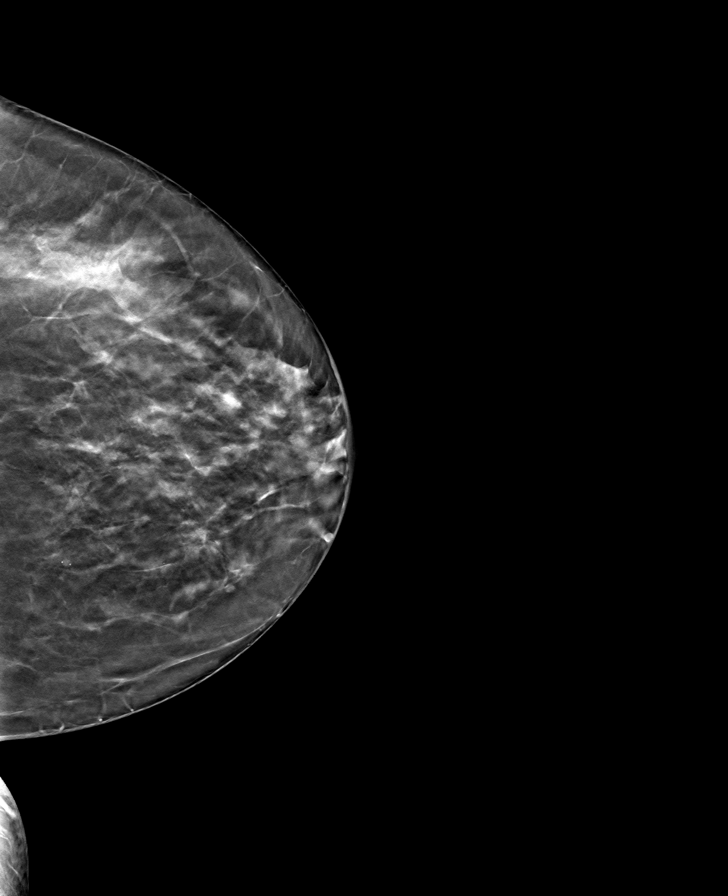

[R MLO tomo · tomo slice 34/67.0]
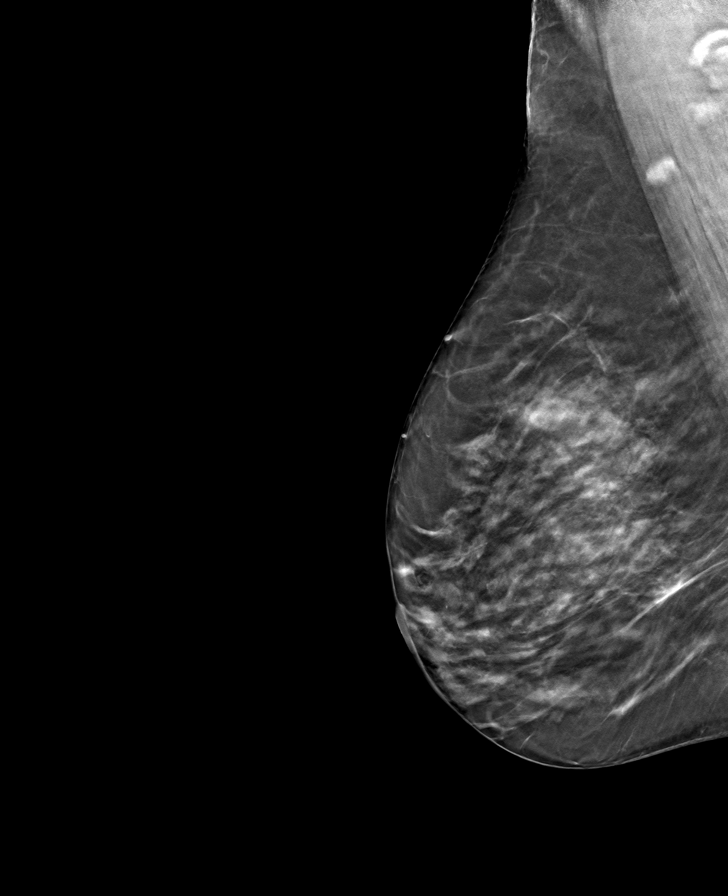

[8 of 24 positions shown; findings below may reference images not displayed]

ACR Breast Density Category b: There are scattered areas of
fibroglandular density.
FINDINGS: There are no findings suspicious for malignancy.
IMPRESSION: No mammographic evidence of malignancy. A result letter of this
screening mammogram will be mailed directly to the patient.

RECOMMENDATION:
Screening mammogram in one year. (Code:51-O-LD2)

BI-RADS CATEGORY  1: Negative.

## 2023-10-13 ENCOUNTER — Other Ambulatory Visit: Payer: Self-pay | Admitting: Nurse Practitioner

## 2023-10-16 ENCOUNTER — Telehealth: Admitting: Family Medicine

## 2023-10-16 ENCOUNTER — Encounter: Payer: Self-pay | Admitting: Family Medicine

## 2023-10-16 DIAGNOSIS — J0100 Acute maxillary sinusitis, unspecified: Secondary | ICD-10-CM | POA: Diagnosis not present

## 2023-10-16 MED ORDER — PREDNISONE 20 MG PO TABS: ORAL_TABLET | ORAL | 0 refills | Status: AC

## 2023-10-16 MED ORDER — AMOXICILLIN-POT CLAVULANATE 875-125 MG PO TABS: 1.0000 | ORAL_TABLET | Freq: Two times a day (BID) | ORAL | 0 refills | Status: AC

## 2023-10-16 NOTE — Progress Notes (Signed)
 Virtual Visit via MyChart video note  I connected with [name] on 10/16/23 at 1322 by video and verified that I am speaking with the correct person using two identifiers. Alexandria Ingram is currently located at home and patient are currently with her during visit. The provider, Lucio Sabin Merek Niu, MD is located in their office at time of visit.  Call ended at 1331  I discussed the limitations, risks, security and privacy concerns of performing an evaluation and management service by video and the availability of in person appointments. I also discussed with the patient that there may be a patient responsible charge related to this service. The patient expressed understanding and agreed to proceed.   History and Present Illness: Patient is calling in for sinus pressure and thought it was invisalign but dentist said it was not.  She is using olive and oregano.  She said that it has has now gone in to her ear and throat.  She denies fevers or chills. She says she gets strep sometimes. 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 She is taking zyrtec. She uses flonase  but it is worsening down in to her throat. She denies wheezing or trouble breathing.   1. Acute non-recurrent maxillary sinusitis     Outpatient Encounter Medications as of 10/16/2023  Medication Sig   amoxicillin -clavulanate (AUGMENTIN ) 875-125 MG tablet Take 1 tablet by mouth 2 (two) times daily.   predniSONE  (DELTASONE ) 20 MG tablet 2 po at same time daily for 5 days   acyclovir  ointment (ZOVIRAX ) 5 % APPLY TO AFFECTED AREA EVERY NIGHT   busPIRone  (BUSPAR ) 10 MG tablet Take 1 tablet (10 mg total) by mouth 2 (two) times daily.   carisoprodol  (SOMA ) 350 MG tablet TAKE 1 TABLET BY MOUTH FOUR TIMES A DAY AS NEEDED FOR MUSCLE SPASM   estradiol  (ESTRACE ) 1 MG tablet Take 1 tablet (1 mg total) by mouth daily.   fluticasone  (FLONASE ) 50 MCG/ACT nasal spray Place 2 sprays into both nostrils daily.   medroxyPROGESTERone  (PROVERA ) 5 MG  tablet TAKE 1 TABLET (5 MG TOTAL) BY MOUTH DAILY.   topiramate  (TOPAMAX ) 50 MG tablet Take 1 tablet (50 mg total) by mouth at bedtime as needed.   valACYclovir  (VALTREX ) 1000 MG tablet Take 1 tablet (1,000 mg total) by mouth 3 (three) times daily.   No facility-administered encounter medications on file as of 10/16/2023.    Review of Systems  Constitutional:  Negative for chills and fever.  HENT:  Positive for congestion, postnasal drip, rhinorrhea, sinus pressure, sneezing and sore throat. Negative for ear discharge and ear pain.   Eyes:  Negative for pain, redness and visual disturbance.  Respiratory:  Positive for cough. Negative for chest tightness and shortness of breath.   Cardiovascular:  Negative for chest pain and leg swelling.  Genitourinary:  Negative for difficulty urinating and dysuria.  Musculoskeletal:  Negative for back pain and gait problem.  Skin:  Negative for rash.  Neurological:  Negative for dizziness, light-headedness and headaches.  Psychiatric/Behavioral:  Negative for agitation and behavioral problems.   All other systems reviewed and are negative.   Observations/Objective: Patient sounds comfortable and in no acute distress.   Assessment and Plan: Problem List Items Addressed This Visit   None Visit Diagnoses       Acute non-recurrent maxillary sinusitis    -  Primary   Relevant Medications   amoxicillin -clavulanate (AUGMENTIN ) 875-125 MG tablet   predniSONE  (DELTASONE ) 20 MG tablet  Will treat for sinus infection and give amoxicillin  and prednisone .  Follow up plan: Return if symptoms worsen or fail to improve.     I discussed the assessment and treatment plan with the patient. The patient was provided an opportunity to ask questions and all were answered. The patient agreed with the plan and demonstrated an understanding of the instructions.   The patient was advised to call back or seek an in-person evaluation if the symptoms worsen or if  the condition fails to improve as anticipated.  The above assessment and management plan was discussed with the patient. The patient verbalized understanding of and has agreed to the management plan. Patient is aware to call the clinic if symptoms persist or worsen. Patient is aware when to return to the clinic for a follow-up visit. Patient educated on when it is appropriate to go to the emergency department.    I provided 9 minutes of non-face-to-face time during this encounter.    Hilton Lucky, MD

## 2023-10-29 MED ORDER — BETAMETHASONE DIPROPIONATE 0.05 % EX CREA: TOPICAL_CREAM | Freq: Two times a day (BID) | CUTANEOUS | 2 refills | Status: DC

## 2023-12-21 ENCOUNTER — Other Ambulatory Visit: Payer: Self-pay | Admitting: Nurse Practitioner

## 2023-12-21 DIAGNOSIS — B029 Zoster without complications: Secondary | ICD-10-CM

## 2023-12-23 MED ORDER — ACYCLOVIR 5 % EX OINT: TOPICAL_OINTMENT | CUTANEOUS | 1 refills | Status: DC

## 2023-12-23 NOTE — Telephone Encounter (Signed)
 Refill failed. resent

## 2023-12-23 NOTE — Addendum Note (Signed)
 Addended by: Forney Kleinpeter D on: 12/23/2023 11:08 AM   Modules accepted: Orders

## 2024-02-04 ENCOUNTER — Ambulatory Visit: Admitting: Nurse Practitioner

## 2024-03-21 ENCOUNTER — Other Ambulatory Visit: Payer: Self-pay | Admitting: Family

## 2024-03-27 LAB — LAB REPORT - SCANNED: EGFR: 76

## 2024-03-30 ENCOUNTER — Encounter: Payer: Self-pay | Admitting: Nurse Practitioner

## 2024-05-12 ENCOUNTER — Other Ambulatory Visit: Payer: Self-pay | Admitting: Nurse Practitioner

## 2024-05-12 DIAGNOSIS — B029 Zoster without complications: Secondary | ICD-10-CM

## 2024-06-05 ENCOUNTER — Telehealth: Payer: Self-pay | Admitting: Nurse Practitioner

## 2024-06-05 DIAGNOSIS — Z1231 Encounter for screening mammogram for malignant neoplasm of breast: Secondary | ICD-10-CM

## 2024-06-05 NOTE — Telephone Encounter (Signed)
 Copied from CRM #[id number]. Topic: Clinical - Request for Lab/Test Order >> Jun 05, 2024 12:13 PM Alfonso ORN wrote: Reason for CRM: patient requesting an order for a routine mammogram Patient preferred to go to the [facility] imaging center     Can leave message in [facility] when order is place and leave a voicemail      ----------------------------------------------------------------------- From previous Reason for Contact - Cancel/Reschedule: Patient/patient representative is calling to cancel or reschedule an appointment. Refer to attachments for appointment information.

## 2024-06-05 NOTE — Telephone Encounter (Signed)
 Mammogram orders placed.

## 2024-06-18 ENCOUNTER — Ambulatory Visit: Admitting: Nurse Practitioner

## 2024-07-08 ENCOUNTER — Other Ambulatory Visit: Payer: Self-pay | Admitting: Nurse Practitioner

## 2024-07-08 DIAGNOSIS — N951 Menopausal and female climacteric states: Secondary | ICD-10-CM

## 2024-07-08 MED ORDER — ESTRADIOL 1 MG PO TABS: 1.0000 mg | ORAL_TABLET | Freq: Every day | ORAL | 0 refills | Status: AC

## 2024-07-08 NOTE — Telephone Encounter (Signed)
 MMM NTBS in March for yrly PE (08/09/23) 30-d given today

## 2024-07-08 NOTE — Addendum Note (Signed)
 Addended by: INA RAMP D on: 07/08/2024 03:44 PM   Modules accepted: Orders

## 2024-07-08 NOTE — Telephone Encounter (Signed)
 CPE 08-13-24 with MMM.

## 2024-08-13 ENCOUNTER — Encounter: Admitting: Nurse Practitioner
